# Patient Record
Sex: Female | Born: 1950 | Race: White | Hispanic: No | Marital: Single | Smoking: Former smoker
Health system: Southern US, Community
[De-identification: ages and names within clinical notes are randomized; demographics above are authoritative.]

## PROBLEM LIST (undated history)

## (undated) DIAGNOSIS — R002 Palpitations: Secondary | ICD-10-CM

## (undated) DIAGNOSIS — F32A Depression, unspecified: Secondary | ICD-10-CM

## (undated) DIAGNOSIS — I4891 Unspecified atrial fibrillation: Secondary | ICD-10-CM

## (undated) DIAGNOSIS — T8859XA Other complications of anesthesia, initial encounter: Secondary | ICD-10-CM

## (undated) DIAGNOSIS — T4145XA Adverse effect of unspecified anesthetic, initial encounter: Secondary | ICD-10-CM

## (undated) DIAGNOSIS — F419 Anxiety disorder, unspecified: Secondary | ICD-10-CM

## (undated) DIAGNOSIS — K219 Gastro-esophageal reflux disease without esophagitis: Secondary | ICD-10-CM

## (undated) DIAGNOSIS — Z8679 Personal history of other diseases of the circulatory system: Secondary | ICD-10-CM

## (undated) DIAGNOSIS — M1990 Unspecified osteoarthritis, unspecified site: Secondary | ICD-10-CM

## (undated) DIAGNOSIS — I10 Essential (primary) hypertension: Secondary | ICD-10-CM

## (undated) DIAGNOSIS — I499 Cardiac arrhythmia, unspecified: Secondary | ICD-10-CM

## (undated) DIAGNOSIS — F329 Major depressive disorder, single episode, unspecified: Secondary | ICD-10-CM

## (undated) DIAGNOSIS — I509 Heart failure, unspecified: Secondary | ICD-10-CM

## (undated) DIAGNOSIS — F410 Panic disorder [episodic paroxysmal anxiety] without agoraphobia: Secondary | ICD-10-CM

## (undated) HISTORY — PX: COLONOSCOPY: SHX174

## (undated) HISTORY — PX: BILATERAL SALPINGOOPHORECTOMY: SHX1223

## (undated) HISTORY — DX: Essential (primary) hypertension: I10

## (undated) HISTORY — PX: OTHER SURGICAL HISTORY: SHX169

## (undated) HISTORY — PX: FOOT SURGERY: SHX648

## (undated) HISTORY — PX: ABDOMINAL HYSTERECTOMY: SHX81

## (undated) HISTORY — PX: FOOT FRACTURE SURGERY: SHX645

---

## 2004-09-10 ENCOUNTER — Ambulatory Visit: Payer: Self-pay | Admitting: Obstetrics and Gynecology

## 2008-02-04 ENCOUNTER — Ambulatory Visit: Payer: Self-pay | Admitting: Pain Medicine

## 2008-02-08 ENCOUNTER — Ambulatory Visit: Payer: Self-pay | Admitting: Pain Medicine

## 2008-02-13 ENCOUNTER — Ambulatory Visit: Payer: Self-pay | Admitting: Pain Medicine

## 2008-03-04 ENCOUNTER — Ambulatory Visit: Payer: Self-pay | Admitting: Pain Medicine

## 2008-03-18 ENCOUNTER — Ambulatory Visit: Payer: Self-pay | Admitting: Physician Assistant

## 2008-04-01 ENCOUNTER — Ambulatory Visit: Payer: Self-pay | Admitting: Pain Medicine

## 2008-04-24 ENCOUNTER — Ambulatory Visit: Payer: Self-pay | Admitting: Physician Assistant

## 2008-09-03 ENCOUNTER — Ambulatory Visit: Payer: Self-pay | Admitting: Pain Medicine

## 2008-09-04 ENCOUNTER — Ambulatory Visit: Payer: Self-pay | Admitting: Pain Medicine

## 2008-11-12 ENCOUNTER — Ambulatory Visit: Payer: Self-pay | Admitting: Pain Medicine

## 2008-11-27 ENCOUNTER — Ambulatory Visit: Payer: Self-pay | Admitting: Pain Medicine

## 2008-12-10 ENCOUNTER — Ambulatory Visit: Payer: Self-pay | Admitting: Pain Medicine

## 2009-01-06 ENCOUNTER — Ambulatory Visit: Payer: Self-pay | Admitting: Pain Medicine

## 2009-11-20 ENCOUNTER — Ambulatory Visit: Payer: Self-pay | Admitting: Pain Medicine

## 2010-04-08 ENCOUNTER — Ambulatory Visit: Payer: Self-pay | Admitting: Pain Medicine

## 2010-07-13 ENCOUNTER — Ambulatory Visit: Payer: Self-pay | Admitting: Family Medicine

## 2010-12-25 ENCOUNTER — Emergency Department: Payer: Self-pay | Admitting: Internal Medicine

## 2011-11-11 ENCOUNTER — Ambulatory Visit: Payer: Self-pay | Admitting: Family Medicine

## 2011-11-23 ENCOUNTER — Ambulatory Visit: Payer: Self-pay | Admitting: Pain Medicine

## 2011-12-01 ENCOUNTER — Ambulatory Visit: Payer: Self-pay | Admitting: Pain Medicine

## 2011-12-15 ENCOUNTER — Ambulatory Visit: Payer: Self-pay | Admitting: Pain Medicine

## 2012-01-10 ENCOUNTER — Ambulatory Visit: Payer: Self-pay | Admitting: Pain Medicine

## 2012-10-31 ENCOUNTER — Ambulatory Visit: Payer: Self-pay | Admitting: Pain Medicine

## 2012-11-01 ENCOUNTER — Ambulatory Visit: Payer: Self-pay | Admitting: Pain Medicine

## 2012-12-12 ENCOUNTER — Ambulatory Visit: Payer: Self-pay | Admitting: Pain Medicine

## 2012-12-12 ENCOUNTER — Other Ambulatory Visit: Payer: Self-pay | Admitting: Pain Medicine

## 2013-11-18 ENCOUNTER — Ambulatory Visit: Payer: Self-pay | Admitting: Pain Medicine

## 2013-11-26 ENCOUNTER — Ambulatory Visit: Payer: Self-pay | Admitting: Pain Medicine

## 2014-05-22 ENCOUNTER — Emergency Department: Payer: Self-pay | Admitting: Student

## 2014-05-28 ENCOUNTER — Ambulatory Visit: Payer: Self-pay | Admitting: Podiatry

## 2014-05-28 LAB — POTASSIUM: Potassium: 4.3 mmol/L (ref 3.5–5.1)

## 2014-05-29 ENCOUNTER — Ambulatory Visit: Payer: Medicaid Other

## 2014-05-29 ENCOUNTER — Ambulatory Visit (INDEPENDENT_AMBULATORY_CARE_PROVIDER_SITE_OTHER): Payer: Medicare Other | Admitting: Podiatry

## 2014-05-29 ENCOUNTER — Encounter: Payer: Self-pay | Admitting: Podiatry

## 2014-05-29 VITALS — BP 143/85 | HR 75 | Resp 13 | Ht 62.0 in | Wt 210.0 lb

## 2014-05-29 DIAGNOSIS — M79605 Pain in left leg: Secondary | ICD-10-CM

## 2014-05-29 DIAGNOSIS — S92002A Unspecified fracture of left calcaneus, initial encounter for closed fracture: Secondary | ICD-10-CM | POA: Diagnosis not present

## 2014-05-29 NOTE — Progress Notes (Signed)
Subjective:     Patient ID: Danielle SleeperXheva Robinson, female   DOB: 09/26/1950, 63 y.o.   MRN: [id number]  HPI patient presents with son who states that she fell a week ago and fractured her heel bone left and they want to do surgery we just wanted to see if you have look at her x-rays and CT scans   Review of Systems  All other systems reviewed and are negative.      Objective:   Physical Exam Unable to evaluate her foot as far as dressing goes that she does has have a softer type cast on which is holding her foot in neutral position teary at currently in a wheelchair    Assessment:     Fracture of calcaneus being treated by the physicians at [facility]    Plan:       reviewed condition and at this time I have recommended that she follow the advice of the physicians and [facility] who did evaluate her and are able to perform her surgery at that hospital. I do think long-term she can develop arthritis I explained that to her and I wished her good lock for what the physicians at the hospital decide is most appropriate for her. She has an appointment tomorrow

## 2014-05-29 NOTE — Progress Notes (Signed)
   Subjective:    Patient ID: Danielle SleeperXheva Robinson, female    DOB: 07/16/1950, 63 y.o.   MRN: [id number]  HPI Comments: Pt presents with her son, Glorianne Manchesterim Tirado.  Mr. Gala LewandowskyBerisha states pt fell 1 week ago down 4 to 5 steps breaking her left heel and ankle.  Pt was seen at [facility], she had x-rays, and CT scan, and was later placed in air fracture walker with a medicated type cast.  Pt's son states it was recommended pt have surgery, and he has the disc for the x-rays and CT scan.  Foot Injury       Review of Systems  All other systems reviewed and are negative.      Objective:   Physical Exam        Assessment & Plan:

## 2014-05-30 ENCOUNTER — Ambulatory Visit: Payer: Self-pay | Admitting: Podiatry

## 2014-11-01 DIAGNOSIS — I10 Essential (primary) hypertension: Secondary | ICD-10-CM | POA: Insufficient documentation

## 2014-11-01 NOTE — Op Note (Signed)
PATIENT NAME:  Danielle Robinson, Danielle Robinson MR#:  [id number] DATE OF BIRTH:  [birth year]  DATE OF PROCEDURE:  05/30/2014  PREOPERATIVE DIAGNOSIS:  Left comminuted calcaneal fracture.   POSTOPERATIVE DIAGNOSIS:  Left comminuted calcaneal fracture.   PROCEDURE:  Open reduction with internal fixation of left calcaneal fracture.   SURGEON:  Chieko Neises A. Ether GriffinsFowler, DPM   ANESTHESIA:  General.   HEMOSTASIS:  Mid-calf tourniquet inflated to 250 mmHg for approximately 90 minutes.   COMPLICATIONS:  None.   SPECIMEN:  None.  OPERATIVE INDICATIONS: This is a 64 year old female seen in the outpatient clinic seen with a joint depression calcaneal fracture of the left heel. We discussed conservative and surgical intervention. She has elected to undergo surgical intervention. The risks, benefits, alternatives, and complications associated with surgery were discussed with the patient and informed consent has been given.   OPERATIVE PROCEDURE:  The patient was brought into the OR, placed on the operating table in supine position. General intubation was administered by the anesthesia team. The patient was then placed in left lateral decubitus position, left side up. The left lower extremity was prepped and draped in the usual sterile fashion.  After inflation of the tourniquet, attention was directed to the lateral aspect of the calcaneus where a sinus tarsi incision was made to the level of the fibula. Approximately a 3 cm incision was made. Sharp and blunt dissection was carried down to the deeper layers. The peroneal tendons were noted and retracted throughout the procedure. The subtalar joint was then entered at this time. The lateral wall of the calcaneus was dissected away bluntly and sharply. There was noted to be a small lateral wall blowout. There was a loose comminuted, lateral, posterior facet fracture fragment that was noted that was impacted within the body of the calcaneus. A small Schanz pin was placed in the  calcaneus through the lateral wall and disimpaction was then performed. I was able to freely move to the lateral portion of the posterior facet fracture at this time. Further evaluation of the subtalar joint revealed a central fracture line. Manipulation was then performed to get the cartilage in line throughout the posterior facet. The lateral portion of the posterior facet was then stabilized with a K wire.  At this time, a small minimal incision, lateral calcaneal wall locking plate from [employer] was then placed, subcutaneous deep to the tendon and soft tissue on the lateral aspect of the calcaneus. The initial sustentacular fragment was stabilized with a compression screw. The posterior tuber   was then stabilized with a compression screw and the subtalar joint, posterior facet was stabilized with a compression screw. The remainder of the screws was filled with locking screws. A total of 6 screws were used at this time. Good stability was noted to the subtalar and posterior facet region. Multiple views showed good alignment of the subtalar joint. Illa LevelBroden view showed good alignment of the posterior facet.  At this time, layered closure was performed after copious amounts of irrigation. The patient was placed in a well compressive sterile dressing after infiltration with 0.25% Marcaine with epinephrine. A total of 18 mL was used. She was placed in an equalizer walker boot for nonweightbearing. She will be discharged home. I will see her in the outpatient clinic in 5 to 7 days. A prescription for Percocet for pain was written today.    ____________________________ Argentina DonovanJustin A. Ether GriffinsFowler, DPM jaf:sw D: 05/30/2014 15:36:00 ET T: 05/30/2014 16:13:25 ET JOB#: [id number]  cc: Jill AlexandersJustin A. Ether GriffinsFowler, DPM, <Dictator> Denasia Venn  Jahnaya Branscome DPM ELECTRONICALLY SIGNED 06/15/2014 18:55

## 2014-11-01 NOTE — Op Note (Signed)
PATIENT NAME:  Danielle SleeperBERISHA, Hadasah MR#:  [id number] DATE OF BIRTH:  09/28/1950  DATE OF PROCEDURE:  05/30/2014  PREOPERATIVE DIAGNOSIS: Left calcaneal fracture.  POSTOPERATIVE DIAGNOSIS: Left calcaneal fracture.    PROCEDURE: Open reduction with internal fixation of left calcaneal fracture.   SURGEON: Karinne Schmader A. Ether GriffinsFowler, DPM   ANESTHESIA: General.   ESTIMATED BLOOD LOSS: Minimal.   COMPLICATIONS: None.   SPECIMEN: None.   OPERATIVE INDICATIONS: This is a 64 year old female who sustained a joint depression calcaneal fracture. We discussed conservative and surgical intervention. She has elected to undergo surgery. All risks, benefits, alternatives, and complications associated with surgery were discussed with the patient and full informed consent has been given.   OPERATIVE PROCEDURE: The patient was brought into the OR and placed on the operating table in the supine position. General intubation was administered by the anesthesia team. The patient was then placed in the lateral decubitus position with the left side up. Attention was directed to the lateral aspect of the calcaneus, where after sterile prep and drape and inflation of the thigh tourniquet, a sinus tarsi incision was made. This began just anterior to the ankle joint, overlying the sinus tarsi to the level of the fibula. Sharp and blunt dissection was carried down to the calcaneus. The peroneal tendons were noted throughout the procedure and retracted. At this time exposure of the subtalar joint was performed. There was noted to be a joint depression calcaneal fracture. The lateral portion of the posterior facet was impacted into the body of the calcaneus and markedly displaced. There was noted a central fracture line within the subtalar joint as well. There was a small lateral wall blowout that was dissected off of the lateral aspect of the heel. At this time initially the displaced lateral posterior facet was stabilized with a K-wire. The  lateral calcaneal wall was placed in a good position. After placement, all areas were then reduced. The minimal incision calcaneal plate was then introduced into the lateral aspect of the incision site into a good position. The initial sustentacular screw was placed to stabilize the plate to be lateral wall of the calcaneus, and this did grab the sustentacular fragment quite well. Next, the posterior facet was then stabilized with a 3.5 screw. A third screw was placed in the posterior arm of the plate. Good stability and alignment was noted. There was noted to be good compression of the fracture site with much better anatomically aligned position. Further screw holes were then filled with a combination of locking and nonlocking screws. The lateral axial and Broden views were taken, that showed good alignment of the calcaneal fracture site. The subtalar joint and posterior facet was in a good anatomically aligned position.   The wound was flushed with copious amounts of irrigation and layered closure was performed with Vicryl and nylon. A block was placed around the ankle with long acting Marcaine injection. She was then placed in a well compressive sterile dressing and back into her boot. She will be nonweightbearing. I will see her in the outpatient clinic in 5-7 days.    ____________________________ Argentina DonovanJustin A. Ether GriffinsFowler, DPM jaf:MT D: 06/06/2014 08:14:01 ET T: 06/06/2014 08:35:59 ET JOB#: [id number]  cc: Jill AlexandersJustin A. Ether GriffinsFowler, DPM, <Dictator> Clary Boulais DPM ELECTRONICALLY SIGNED 06/15/2014 18:55

## 2015-08-13 ENCOUNTER — Other Ambulatory Visit: Payer: Self-pay | Admitting: Orthopedic Surgery

## 2015-08-13 DIAGNOSIS — G8929 Other chronic pain: Secondary | ICD-10-CM

## 2015-08-13 DIAGNOSIS — M25561 Pain in right knee: Principal | ICD-10-CM

## 2015-09-03 ENCOUNTER — Ambulatory Visit
Admission: RE | Admit: 2015-09-03 | Discharge: 2015-09-03 | Disposition: A | Discharge status: Home / Self Care | Payer: Medicare Other | Source: Ambulatory Visit | Attending: Orthopedic Surgery | Admitting: Orthopedic Surgery

## 2015-09-03 DIAGNOSIS — M659 Synovitis and tenosynovitis, unspecified: Secondary | ICD-10-CM | POA: Insufficient documentation

## 2015-09-03 DIAGNOSIS — M25861 Other specified joint disorders, right knee: Secondary | ICD-10-CM | POA: Insufficient documentation

## 2015-09-03 DIAGNOSIS — M1711 Unilateral primary osteoarthritis, right knee: Secondary | ICD-10-CM | POA: Insufficient documentation

## 2015-09-03 DIAGNOSIS — M25461 Effusion, right knee: Secondary | ICD-10-CM | POA: Diagnosis not present

## 2015-09-03 DIAGNOSIS — S83221A Peripheral tear of medial meniscus, current injury, right knee, initial encounter: Secondary | ICD-10-CM | POA: Insufficient documentation

## 2015-09-03 DIAGNOSIS — S83271A Complex tear of lateral meniscus, current injury, right knee, initial encounter: Secondary | ICD-10-CM | POA: Insufficient documentation

## 2015-09-03 DIAGNOSIS — M25561 Pain in right knee: Secondary | ICD-10-CM | POA: Diagnosis present

## 2015-09-03 DIAGNOSIS — G8929 Other chronic pain: Secondary | ICD-10-CM | POA: Insufficient documentation

## 2015-09-18 ENCOUNTER — Encounter
Admission: RE | Admit: 2015-09-18 | Discharge: 2015-09-18 | Disposition: A | Discharge status: Home / Self Care | Payer: Medicare Other | Source: Ambulatory Visit | Attending: Orthopedic Surgery | Admitting: Orthopedic Surgery

## 2015-09-18 DIAGNOSIS — Z01810 Encounter for preprocedural cardiovascular examination: Secondary | ICD-10-CM | POA: Insufficient documentation

## 2015-09-18 DIAGNOSIS — Z01812 Encounter for preprocedural laboratory examination: Secondary | ICD-10-CM | POA: Insufficient documentation

## 2015-09-18 HISTORY — DX: Major depressive disorder, single episode, unspecified: F32.9

## 2015-09-18 HISTORY — DX: Other complications of anesthesia, initial encounter: T88.59XA

## 2015-09-18 HISTORY — DX: Depression, unspecified: F32.A

## 2015-09-18 HISTORY — DX: Palpitations: R00.2

## 2015-09-18 HISTORY — DX: Anxiety disorder, unspecified: F41.9

## 2015-09-18 HISTORY — DX: Unspecified osteoarthritis, unspecified site: M19.90

## 2015-09-18 HISTORY — DX: Personal history of other diseases of the circulatory system: Z86.79

## 2015-09-18 HISTORY — DX: Unspecified atrial fibrillation: I48.91

## 2015-09-18 HISTORY — DX: Adverse effect of unspecified anesthetic, initial encounter: T41.45XA

## 2015-09-18 LAB — CBC
HCT: 37.4 % (ref 35.0–47.0)
HEMOGLOBIN: 12.6 g/dL (ref 12.0–16.0)
MCH: 29.7 pg (ref 26.0–34.0)
MCHC: 33.6 g/dL (ref 32.0–36.0)
MCV: 88.6 fL (ref 80.0–100.0)
Platelets: 205 10*3/uL (ref 150–440)
RBC: 4.23 MIL/uL (ref 3.80–5.20)
RDW: 13.8 % (ref 11.5–14.5)
WBC: 4.1 10*3/uL (ref 3.6–11.0)

## 2015-09-18 NOTE — Patient Instructions (Signed)
  Your procedure is scheduled on: Monday 09/28/15 Report to Day Surgery. 2ND FLOOR MEDICAL MALL ENTRANCE To find out your arrival time please call [phone] between 1PM - 3PM on Friday 09/25/15.  Remember: Instructions that are not followed completely may result in serious medical risk, up to and including death, or upon the discretion of your surgeon and anesthesiologist your surgery may need to be rescheduled.    __X__ 1. Do not eat food or drink liquids after midnight. No gum chewing or hard candies.     __X__ 2. No Alcohol for 24 hours before or after surgery.   ____ 3. Bring all medications with you on the day of surgery if instructed.    __X__ 4. Notify your doctor if there is any change in your medical condition     (cold, fever, infections).     Do not wear jewelry, make-up, hairpins, clips or nail polish.  Do not wear lotions, powders, or perfumes.   Do not shave 48 hours prior to surgery. Men may shave face and neck.  Do not bring valuables to the hospital.    [facility] any belongings or valuables.               Contacts, dentures or bridgework may not be worn into surgery.  Leave your suitcase in the car. After surgery it may be brought to your room.  For patients admitted to the hospital, discharge time is determined by your                treatment team.   Patients discharged the day of surgery will not be allowed to drive home.   Please read over the following fact sheets that you were given:   Surgical Site Infection Prevention   __X__ Take these medicines the morning of surgery with A SIP OF WATER:    1. VERAPAMIL  2.   3.   4.  5.  6.  ____ Fleet Enema (as directed)   __X__ Use CHG Soap as directed  ____ Use inhalers on the day of surgery  ____ Stop metformin 2 days prior to surgery    ____ Take 1/2 of usual insulin dose the night before surgery and none on the morning of surgery.   ____ Stop Coumadin/Plavix/aspirin on    __X__ Stop Anti-inflammatories on STOP IBUPROFEN 7 DAYS PRIOR TO SURGERY YOU MAY TAKE TYLENOL ONLY FOR PAIN IF NEEDED   __X__ Stop supplements until after surgery.  STOP FISH OIL AND VITAMIN B12 AS ABOVE  ____ Bring C-Pap to the hospital.

## 2015-09-28 ENCOUNTER — Encounter: Payer: Self-pay | Admitting: Orthopedic Surgery

## 2015-09-28 ENCOUNTER — Ambulatory Visit: Payer: Medicare Other | Admitting: Anesthesiology

## 2015-09-28 ENCOUNTER — Ambulatory Visit
Admission: RE | Admit: 2015-09-28 | Discharge: 2015-09-28 | Disposition: A | Discharge status: Home / Self Care | Payer: Medicare Other | Source: Ambulatory Visit | Attending: Orthopedic Surgery | Admitting: Orthopedic Surgery

## 2015-09-28 ENCOUNTER — Encounter
Admission: RE | Disposition: A | Discharge status: Home / Self Care | Payer: Self-pay | Source: Ambulatory Visit | Attending: Orthopedic Surgery

## 2015-09-28 DIAGNOSIS — M23252 Derangement of posterior horn of lateral meniscus due to old tear or injury, left knee: Secondary | ICD-10-CM | POA: Insufficient documentation

## 2015-09-28 DIAGNOSIS — M23242 Derangement of anterior horn of lateral meniscus due to old tear or injury, left knee: Secondary | ICD-10-CM | POA: Diagnosis not present

## 2015-09-28 DIAGNOSIS — F329 Major depressive disorder, single episode, unspecified: Secondary | ICD-10-CM | POA: Diagnosis not present

## 2015-09-28 DIAGNOSIS — I10 Essential (primary) hypertension: Secondary | ICD-10-CM | POA: Diagnosis not present

## 2015-09-28 DIAGNOSIS — I4891 Unspecified atrial fibrillation: Secondary | ICD-10-CM | POA: Insufficient documentation

## 2015-09-28 DIAGNOSIS — Z79899 Other long term (current) drug therapy: Secondary | ICD-10-CM | POA: Diagnosis not present

## 2015-09-28 DIAGNOSIS — Z87891 Personal history of nicotine dependence: Secondary | ICD-10-CM | POA: Diagnosis not present

## 2015-09-28 DIAGNOSIS — M23221 Derangement of posterior horn of medial meniscus due to old tear or injury, right knee: Secondary | ICD-10-CM | POA: Diagnosis not present

## 2015-09-28 DIAGNOSIS — M2391 Unspecified internal derangement of right knee: Secondary | ICD-10-CM | POA: Insufficient documentation

## 2015-09-28 DIAGNOSIS — M94261 Chondromalacia, right knee: Secondary | ICD-10-CM | POA: Diagnosis not present

## 2015-09-28 HISTORY — PX: KNEE ARTHROSCOPY: SHX127

## 2015-09-28 LAB — POCT I-STAT 4, (NA,K, GLUC, HGB,HCT)
GLUCOSE: 90 mg/dL (ref 65–99)
HCT: 44 % (ref 36.0–46.0)
Hemoglobin: 15 g/dL (ref 12.0–15.0)
POTASSIUM: 4 mmol/L (ref 3.5–5.1)
Sodium: 141 mmol/L (ref 135–145)

## 2015-09-28 SURGERY — ARTHROSCOPY, KNEE
Anesthesia: General | Site: Knee | Laterality: Right | Wound class: Clean

## 2015-09-28 MED ORDER — FENTANYL CITRATE (PF) 100 MCG/2ML IJ SOLN
25.0000 ug | INTRAMUSCULAR | Status: DC | PRN
  Administered 2015-09-28 (×4): 25 ug via INTRAVENOUS

## 2015-09-28 MED ORDER — HYDROCODONE-ACETAMINOPHEN 5-325 MG PO TABS
1.0000 | ORAL_TABLET | ORAL | Status: DC | PRN
  Administered 2015-09-28: 1 via ORAL

## 2015-09-28 MED ORDER — HYDROCODONE-ACETAMINOPHEN 5-325 MG PO TABS
ORAL_TABLET | ORAL | Status: AC
  Filled 2015-09-28: qty 1

## 2015-09-28 MED ORDER — ONDANSETRON HCL 4 MG PO TABS
4.0000 mg | ORAL_TABLET | Freq: Four times a day (QID) | ORAL | Status: DC | PRN
Start: 2015-09-28 — End: 2015-09-28

## 2015-09-28 MED ORDER — MORPHINE SULFATE (PF) 4 MG/ML IV SOLN
INTRAVENOUS | Status: AC
  Filled 2015-09-28: qty 1

## 2015-09-28 MED ORDER — FENTANYL CITRATE (PF) 100 MCG/2ML IJ SOLN
INTRAMUSCULAR | Status: AC
  Administered 2015-09-28: 25 ug via INTRAVENOUS
  Filled 2015-09-28: qty 2

## 2015-09-28 MED ORDER — FAMOTIDINE 20 MG PO TABS
ORAL_TABLET | ORAL | Status: AC
  Filled 2015-09-28: qty 1

## 2015-09-28 MED ORDER — ONDANSETRON HCL 4 MG/2ML IJ SOLN
INTRAMUSCULAR | Status: AC
  Filled 2015-09-28: qty 2

## 2015-09-28 MED ORDER — HYDROCODONE-ACETAMINOPHEN 5-325 MG PO TABS: 1.0000 | ORAL_TABLET | ORAL | Status: DC | PRN

## 2015-09-28 MED ORDER — ONDANSETRON HCL 4 MG/2ML IJ SOLN
INTRAMUSCULAR | Status: DC | PRN
  Administered 2015-09-28: 4 mg via INTRAVENOUS

## 2015-09-28 MED ORDER — ACETAMINOPHEN 10 MG/ML IV SOLN
INTRAVENOUS | Status: DC | PRN
  Administered 2015-09-28: 1000 mg via INTRAVENOUS

## 2015-09-28 MED ORDER — ONDANSETRON HCL 4 MG/2ML IJ SOLN
4.0000 mg | Freq: Once | INTRAMUSCULAR | Status: AC | PRN
  Administered 2015-09-28: 4 mg via INTRAVENOUS

## 2015-09-28 MED ORDER — MORPHINE SULFATE (PF) 4 MG/ML IV SOLN
INTRAVENOUS | Status: DC | PRN
  Administered 2015-09-28: 4 mg

## 2015-09-28 MED ORDER — MIDAZOLAM HCL 2 MG/2ML IJ SOLN
INTRAMUSCULAR | Status: DC | PRN
  Administered 2015-09-28: 2 mg via INTRAVENOUS

## 2015-09-28 MED ORDER — DEXAMETHASONE SODIUM PHOSPHATE 10 MG/ML IJ SOLN
INTRAMUSCULAR | Status: DC | PRN
  Administered 2015-09-28: 5 mg via INTRAVENOUS

## 2015-09-28 MED ORDER — GLYCOPYRROLATE 0.2 MG/ML IJ SOLN
INTRAMUSCULAR | Status: DC | PRN
  Administered 2015-09-28: 0.2 mg via INTRAVENOUS

## 2015-09-28 MED ORDER — LIDOCAINE HCL (CARDIAC) 20 MG/ML IV SOLN
INTRAVENOUS | Status: DC | PRN
  Administered 2015-09-28: 80 mg via INTRAVENOUS

## 2015-09-28 MED ORDER — SODIUM CHLORIDE 0.9 % IV SOLN: INTRAVENOUS | Status: DC

## 2015-09-28 MED ORDER — ACETAMINOPHEN 10 MG/ML IV SOLN
INTRAVENOUS | Status: AC
  Filled 2015-09-28: qty 100

## 2015-09-28 MED ORDER — BUPIVACAINE-EPINEPHRINE (PF) 0.25% -1:200000 IJ SOLN
INTRAMUSCULAR | Status: AC
  Filled 2015-09-28: qty 30

## 2015-09-28 MED ORDER — FAMOTIDINE 20 MG PO TABS
20.0000 mg | ORAL_TABLET | Freq: Once | ORAL | Status: AC
  Administered 2015-09-28: 20 mg via ORAL

## 2015-09-28 MED ORDER — LACTATED RINGERS IV SOLN
INTRAVENOUS | Status: DC
  Administered 2015-09-28 (×2): via INTRAVENOUS

## 2015-09-28 MED ORDER — METOCLOPRAMIDE HCL 10 MG PO TABS: 5.0000 mg | ORAL_TABLET | Freq: Three times a day (TID) | ORAL | Status: DC | PRN

## 2015-09-28 MED ORDER — ONDANSETRON HCL 4 MG/2ML IJ SOLN: 4.0000 mg | Freq: Four times a day (QID) | INTRAMUSCULAR | Status: DC | PRN

## 2015-09-28 MED ORDER — FENTANYL CITRATE (PF) 100 MCG/2ML IJ SOLN
INTRAMUSCULAR | Status: DC | PRN
  Administered 2015-09-28: 25 ug via INTRAVENOUS
  Administered 2015-09-28: 50 ug via INTRAVENOUS
  Administered 2015-09-28: 25 ug via INTRAVENOUS

## 2015-09-28 MED ORDER — METOCLOPRAMIDE HCL 5 MG/ML IJ SOLN: 5.0000 mg | Freq: Three times a day (TID) | INTRAMUSCULAR | Status: DC | PRN

## 2015-09-28 MED ORDER — BUPIVACAINE-EPINEPHRINE 0.25% -1:200000 IJ SOLN
INTRAMUSCULAR | Status: DC | PRN
  Administered 2015-09-28: 25 mL
  Administered 2015-09-28: 5 mL

## 2015-09-28 MED ORDER — PROPOFOL 10 MG/ML IV BOLUS
INTRAVENOUS | Status: DC | PRN
  Administered 2015-09-28: 170 mg via INTRAVENOUS

## 2015-09-28 SURGICAL SUPPLY — 21 items
BLADE SHAVER 4.5 DBL SERAT CV (CUTTER) ×3 IMPLANT
BNDG ESMARK 6X12 TAN STRL LF (GAUZE/BANDAGES/DRESSINGS) ×3 IMPLANT
DRSG DERMACEA 8X12 NADH (GAUZE/BANDAGES/DRESSINGS) ×3 IMPLANT
DURAPREP 26ML APPLICATOR (WOUND CARE) ×6 IMPLANT
GAUZE SPONGE 4X4 12PLY STRL (GAUZE/BANDAGES/DRESSINGS) ×3 IMPLANT
GLOVE BIOGEL M STRL SZ7.5 (GLOVE) ×3 IMPLANT
GLOVE INDICATOR 8.0 STRL GRN (GLOVE) ×3 IMPLANT
GOWN STRL REUS W/ TWL LRG LVL3 (GOWN DISPOSABLE) ×2 IMPLANT
GOWN STRL REUS W/TWL LRG LVL3 (GOWN DISPOSABLE) ×4
IV LACTATED RINGER IRRG 3000ML (IV SOLUTION) ×12
IV LR IRRIG 3000ML ARTHROMATIC (IV SOLUTION) ×6 IMPLANT
MANIFOLD NEPTUNE II (INSTRUMENTS) ×3 IMPLANT
PACK ARTHROSCOPY KNEE (MISCELLANEOUS) ×3 IMPLANT
SET TUBE SUCT SHAVER OUTFL 24K (TUBING) ×3 IMPLANT
SET TUBE TIP INTRA-ARTICULAR (MISCELLANEOUS) ×3 IMPLANT
STRAP SAFETY BODY (MISCELLANEOUS) ×3 IMPLANT
SUT ETHILON 3-0 FS-10 30 BLK (SUTURE) ×3
SUTURE EHLN 3-0 FS-10 30 BLK (SUTURE) ×1 IMPLANT
TUBING ARTHRO INFLOW-ONLY STRL (TUBING) ×3 IMPLANT
WAND HAND CNTRL MULTIVAC 50 (MISCELLANEOUS) ×3 IMPLANT
WRAP KNEE W/COLD PACKS 25.5X14 (SOFTGOODS) ×3 IMPLANT

## 2015-09-28 NOTE — Anesthesia Preprocedure Evaluation (Addendum)
Anesthesia Evaluation  Patient identified by MRN, date of birth, ID band Patient awake    Reviewed: Allergy & Precautions, H&P , NPO status , Patient's Chart, lab work & pertinent test results, reviewed documented beta blocker date and time   History of Anesthesia Complications (+) PROLONGED EMERGENCE and history of anesthetic complications  Airway Mallampati: III  TM Distance: >3 FB Neck ROM: full    Dental  (+) Teeth Intact, Partial Upper   Pulmonary neg pulmonary ROS, former smoker,    Pulmonary exam normal        Cardiovascular Exercise Tolerance: Good hypertension, negative cardio ROS Normal cardiovascular examAtrial Fibrillation  Rate:Normal     Neuro/Psych PSYCHIATRIC DISORDERS negative neurological ROS  negative psych ROS   GI/Hepatic negative GI ROS, Neg liver ROS,   Endo/Other  negative endocrine ROS  Renal/GU negative Renal ROS  negative genitourinary   Musculoskeletal   Abdominal   Peds  Hematology negative hematology ROS (+)   Anesthesia Other Findings   Reproductive/Obstetrics negative OB ROS                            Anesthesia Physical Anesthesia Plan  ASA: III  Anesthesia Plan: General LMA   Post-op Pain Management:    Induction:   Airway Management Planned:   Additional Equipment:   Intra-op Plan:   Post-operative Plan:   Informed Consent: I have reviewed the patients History and Physical, chart, labs and discussed the procedure including the risks, benefits and alternatives for the proposed anesthesia with the patient or authorized representative who has indicated his/her understanding and acceptance.     Plan Discussed with: CRNA  Anesthesia Plan Comments:        Anesthesia Quick Evaluation

## 2015-09-28 NOTE — Anesthesia Postprocedure Evaluation (Signed)
Anesthesia Post Note  Patient: Danielle Robinson  Procedure(s) Performed: Procedure(s) (LRB): ARTHROSCOPY KNEE, MEDIAL AND LATERAL MENISECTOMY, CHONDROPLASTY (Right)  Patient location during evaluation: PACU Anesthesia Type: General Level of consciousness: awake and alert Pain management: pain level controlled Vital Signs Assessment: post-procedure vital signs reviewed and stable Respiratory status: spontaneous breathing and respiratory function stable Cardiovascular status: stable Anesthetic complications: no    Last Vitals:  Filed Vitals:   09/28/15 1736 09/28/15 1743  BP: 113/69   Pulse: 58 58  Temp: 36.3 C   Resp: 17 12    Last Pain:  Filed Vitals:   09/28/15 1744  PainSc: Asleep                 KEPHART,WILLIAM K

## 2015-09-28 NOTE — Anesthesia Procedure Notes (Signed)
Procedure Name: LMA Insertion Date/Time: 09/28/2015 3:53 PM Performed by: Stormy FabianURTIS, Genny Caulder Pre-anesthesia Checklist: Patient identified, Patient being monitored, Timeout performed, Emergency Drugs available and Suction available Patient Re-evaluated:Patient Re-evaluated prior to inductionOxygen Delivery Method: Circle system utilized Preoxygenation: Pre-oxygenation with 100% oxygen Intubation Type: IV induction Ventilation: Mask ventilation without difficulty LMA: LMA inserted LMA Size: 4.5 Tube type: Oral Number of attempts: 1 Placement Confirmation: positive ETCO2 and breath sounds checked- equal and bilateral Tube secured with: Tape Dental Injury: Teeth and Oropharynx as per pre-operative assessment

## 2015-09-28 NOTE — Brief Op Note (Signed)
09/28/2015  5:34 PM  PATIENT:  Danielle SleeperXheva Paige  65 y.o. female  PRE-OPERATIVE DIAGNOSIS:  INTERNAL DERANGEMENT, right knee  POST-OPERATIVE DIAGNOSIS:  tear posterior horn medial meniscus, tear anterior & posterior horn lateral meniscus, grade IV chondromalacia lateral compartment, grade III patellofemoral  PROCEDURE:  Procedure(s): ARTHROSCOPY KNEE, MEDIAL AND LATERAL MENISECTOMY, CHONDROPLASTY (Right)  SURGEON:  Surgeon(s) and Role:    * Donato HeinzJames P Julius Boniface, MD - Primary  ASSISTANTS: none   ANESTHESIA:   general  EBL:  Total I/O In: 1000 [I.V.:1000] Out: -   BLOOD ADMINISTERED:none  DRAINS: none   LOCAL MEDICATIONS USED:  MARCAINE     SPECIMEN:  No Specimen  DISPOSITION OF SPECIMEN:  N/A  COUNTS:  YES  TOURNIQUET:   not used  DICTATION: .Office managerDragon Dictation  PLAN OF CARE: Discharge to home after PACU  PATIENT DISPOSITION:  PACU - hemodynamically stable.   Delay start of Pharmacological VTE agent (>24hrs) due to surgical blood loss or risk of bleeding: not applicable

## 2015-09-28 NOTE — Discharge Instructions (Signed)
°  Instructions after Knee Arthroscopy  ° °- James P. Hooten, Jr., M.D.    ° Dept. of Orthopaedics & Sports Medicine ° [facility] ° [address] ° [place], [place]  [address] ° ° Phone: [phone]   Fax: [phone] ° ° °DIET: °• Drink plenty of non-alcoholic fluids & begin a light diet. °• Resume your normal diet the day after surgery. ° °ACTIVITY:  °• You may use crutches or a walker with weight-bearing as tolerated, unless instructed otherwise. °• You may wean yourself off of the walker or crutches as tolerated.  °• Begin doing gentle exercises. Exercising will reduce the pain and swelling, increase motion, and prevent muscle weakness.   °• Avoid strenuous activities or athletics for a minimum of 4-6 weeks after arthroscopic surgery. °• Do not drive or operate any equipment until instructed. ° °WOUND CARE:  °• Place one to two pillows under the knee the first day or two when sitting or lying.  °• Continue to use the ice packs periodically to reduce pain and swelling. °• The small incisions in your knee are closed with nylon stitches. The stitches will be removed in the office. °• The bulky dressing may be removed on the second day after surgery. DO NOT TOUCH THE STITCHES. Put a Band-Aid over each stitch. Do NOT use any ointments or creams on the incisions.  °• You may bathe or shower after the stitches are removed at the first office visit following surgery. ° °MEDICATIONS: °• You may resume your regular medications. °• Please take the pain medication as prescribed. °• Do not take pain medication on an empty stomach. °• Do not drive or drink alcoholic beverages when taking pain medications. ° °CALL THE OFFICE FOR: °• Temperature above 101 degrees °• Excessive bleeding or drainage on the dressing. °• Excessive swelling, coldness, or paleness of the toes. °• Persistent nausea and vomiting. ° °FOLLOW-UP:  °• You should have an appointment to return to the office in 7-10 days after surgery.   ° ° °AMBULATORY SURGERY  °DISCHARGE INSTRUCTIONS ° ° °1) The drugs that you were given will stay in your system until tomorrow so for the next 24 hours you should not: ° °A) Drive an automobile °B) Make any legal decisions °C) Drink any alcoholic beverage ° ° °2) You may resume regular meals tomorrow.  Today it is better to start with liquids and gradually work up to solid foods. ° °You may eat anything you prefer, but it is better to start with liquids, then soup and crackers, and gradually work up to solid foods. ° ° °3) Please notify your doctor immediately if you have any unusual bleeding, trouble breathing, redness and pain at the surgery site, drainage, fever, or pain not relieved by medication. ° ° ° °4) Additional Instructions: ° ° ° ° ° ° ° °Please contact your physician with any problems or Same Day Surgery at [phone], Monday through Friday 6 am to 4 pm, or [place] [facility] [place] Main number at [phone]. ° °

## 2015-09-28 NOTE — Transfer of Care (Signed)
Immediate Anesthesia Transfer of Care Note  Patient: Danielle Robinson  Procedure(s) Performed: Procedure(s): ARTHROSCOPY KNEE, MEDIAL AND LATERAL MENISECTOMY, CHONDROPLASTY (Right)  Patient Location: PACU  Anesthesia Type:General  Level of Consciousness: sedated  Airway & Oxygen Therapy: Patient Spontanous Breathing and Patient connected to face mask oxygen  Post-op Assessment: Post -op Vital signs reviewed and stable  Post vital signs: stable  Last Vitals:  Filed Vitals:   09/28/15 1430  BP: 159/83  Pulse: 69  Temp: 36.2 C  Resp: 16    Complications: No apparent anesthesia complications

## 2015-09-28 NOTE — Op Note (Signed)
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   ASSISTANT: none  ANESTHESIA: general  ESTIMATED BLOOD LOSS: Minimal  FLUIDS REPLACED: 1000 mL of crystalloid  TOURNIQUET TIME: Not used   DRAINS: none  IMPLANTS UTILIZED: None  INDICATIONS FOR SURGERY: Danielle Robinson is a 65 y.o. year old female who has been seen for complaints of right knee pain. MRI demonstrated findings consistent with meniscal pathology. After discussion of the risks and benefits of surgical intervention, the patient expressed understanding of the risks benefits and agree with plans for right knee arthroscopy.   PROCEDURE IN DETAIL: The patient was brought into the operating room and, after adequate general anesthesia was achieved, a tourniquet was applied to the right thigh and the leg was placed in the leg holder. All bony prominences were well padded. The patient's right knee was cleaned and prepped with alcohol and Duraprep and draped in the usual sterile fashion. A "timeout" was performed as per usual protocol. The anticipated portal sites were injected with 0.25% Marcaine with epinephrine. An anterolateral incision was made and a cannula was inserted. A moderate effusion was evacuated and the knee was distended with fluid using the pump. The scope was advanced down the medial gutter into the medial compartment. Under visualization with the  scope, an anteromedial portal was created and a hooked probe was inserted. The medial meniscus was visualized and probed. There was a horizontal tear involving the posterior horn of the medial meniscus. The tear was debrided using meniscal punches and a 4.5 mm incisor shaver. Final contouring was performed using an ArthroCare wand. The remaining rim of meniscus was probed and felt to be stable. The articular cartilage was visualized. There was an area of grade 3 chondromalacia involving the posterior aspect of the medial femoral condyle. The area was debrided and contoured using the ArthroCare wand.  The scope was then advanced into the intercondylar notch. The anterior cruciate ligament was visualized and probed and felt to be intact. The scope was removed from the lateral portal and reinserted via the anteromedial portal to better visualize the lateral compartment. The lateral meniscus was visualized and probed. A complex degenerative tears were noted involving both the anterior and posterior horns of the lateral meniscus. The tears were debrided using meniscal punches and a 4.5 mm incisor shaver. Almost the entire anterior horn was resected. An ulnar rim of the posterior horn was still present. Final contouring was performed using the 50 ArthroCare wand. The articular cartilage of the lateral compartment was visualized. Full-thickness loss of the articular cartilage was noted to both the lateral tibial plateau and lateral femoral condyle. The margins were contoured using the ArthroCare wand. Finally, the scope was advanced so as to visualize the patellofemoral articulation. Good patellar tracking was appreciated. Grade 3 changes of chondromalacia were noted to the patellar facets as well as to the sulcus. These areas were contoured and debrided using the ArthroCare wand.  The knee was  irrigated with copius amounts of fluid and suctioned dry. The anterolateral portal was re-approximated with #3-0 nylon. A  combination of 0.25% Marcaine with epinephrine and 4 mg of Morphine were injected via the scope. The scope was removed and the anteromedial portal was re-approximated with #3-0 nylon. A sterile dressing was applied followed by application of an ice wrap.  The patient tolerated the procedure well and was transported to the PACU in stable condition.  James P. Angie FavaHooten, Jr., M.D.

## 2015-09-28 NOTE — H&P (Signed)
The patient has been re-examined, and the chart reviewed, and there have been no interval changes to the documented history and physical.    The risks, benefits, and alternatives have been discussed at length. The patient expressed understanding of the risks benefits and agreed with plans for surgical intervention.  Kawon Willcutt P. Pammy Vesey, Jr. M.D.    

## 2015-09-29 ENCOUNTER — Encounter: Payer: Self-pay | Admitting: Orthopedic Surgery

## 2016-07-05 DIAGNOSIS — E669 Obesity, unspecified: Secondary | ICD-10-CM | POA: Insufficient documentation

## 2018-08-10 ENCOUNTER — Encounter: Payer: Self-pay | Admitting: *Deleted

## 2018-08-13 ENCOUNTER — Ambulatory Visit: Payer: Medicare Other | Admitting: Anesthesiology

## 2018-08-13 ENCOUNTER — Encounter
Admission: RE | Disposition: A | Discharge status: Home / Self Care | Payer: Self-pay | Source: Home / Self Care | Attending: Unknown Physician Specialty

## 2018-08-13 ENCOUNTER — Encounter: Payer: Self-pay | Admitting: Anesthesiology

## 2018-08-13 ENCOUNTER — Ambulatory Visit
Admission: RE | Admit: 2018-08-13 | Discharge: 2018-08-13 | Disposition: A | Discharge status: Home / Self Care | Payer: Medicare Other | Attending: Unknown Physician Specialty | Admitting: Unknown Physician Specialty

## 2018-08-13 DIAGNOSIS — Z1211 Encounter for screening for malignant neoplasm of colon: Secondary | ICD-10-CM | POA: Diagnosis present

## 2018-08-13 DIAGNOSIS — Z7982 Long term (current) use of aspirin: Secondary | ICD-10-CM | POA: Insufficient documentation

## 2018-08-13 DIAGNOSIS — K5730 Diverticulosis of large intestine without perforation or abscess without bleeding: Secondary | ICD-10-CM | POA: Diagnosis not present

## 2018-08-13 DIAGNOSIS — I10 Essential (primary) hypertension: Secondary | ICD-10-CM | POA: Diagnosis not present

## 2018-08-13 DIAGNOSIS — M1990 Unspecified osteoarthritis, unspecified site: Secondary | ICD-10-CM | POA: Diagnosis not present

## 2018-08-13 DIAGNOSIS — I4891 Unspecified atrial fibrillation: Secondary | ICD-10-CM | POA: Insufficient documentation

## 2018-08-13 DIAGNOSIS — Z79899 Other long term (current) drug therapy: Secondary | ICD-10-CM | POA: Insufficient documentation

## 2018-08-13 DIAGNOSIS — F419 Anxiety disorder, unspecified: Secondary | ICD-10-CM | POA: Insufficient documentation

## 2018-08-13 DIAGNOSIS — K640 First degree hemorrhoids: Secondary | ICD-10-CM | POA: Insufficient documentation

## 2018-08-13 DIAGNOSIS — F329 Major depressive disorder, single episode, unspecified: Secondary | ICD-10-CM | POA: Diagnosis not present

## 2018-08-13 DIAGNOSIS — Z87891 Personal history of nicotine dependence: Secondary | ICD-10-CM | POA: Insufficient documentation

## 2018-08-13 HISTORY — PX: COLONOSCOPY WITH PROPOFOL: SHX5780

## 2018-08-13 HISTORY — DX: Panic disorder (episodic paroxysmal anxiety): F41.0

## 2018-08-13 SURGERY — COLONOSCOPY WITH PROPOFOL
Anesthesia: General

## 2018-08-13 MED ORDER — EPHEDRINE SULFATE 50 MG/ML IJ SOLN
INTRAMUSCULAR | Status: DC | PRN
  Administered 2018-08-13 (×2): 10 mg via INTRAVENOUS

## 2018-08-13 MED ORDER — MIDAZOLAM HCL 2 MG/2ML IJ SOLN
INTRAMUSCULAR | Status: AC
  Filled 2018-08-13: qty 2

## 2018-08-13 MED ORDER — SODIUM CHLORIDE 0.9 % IV SOLN
INTRAVENOUS | Status: DC
  Administered 2018-08-13: 11:00:00 via INTRAVENOUS

## 2018-08-13 MED ORDER — FENTANYL CITRATE (PF) 100 MCG/2ML IJ SOLN
INTRAMUSCULAR | Status: DC | PRN
  Administered 2018-08-13: 50 ug via INTRAVENOUS

## 2018-08-13 MED ORDER — SODIUM CHLORIDE 0.9 % IV SOLN: INTRAVENOUS | Status: DC

## 2018-08-13 MED ORDER — PROPOFOL 500 MG/50ML IV EMUL
INTRAVENOUS | Status: DC | PRN
  Administered 2018-08-13: 120 ug/kg/min via INTRAVENOUS

## 2018-08-13 MED ORDER — EPHEDRINE SULFATE 50 MG/ML IJ SOLN
INTRAMUSCULAR | Status: AC
  Filled 2018-08-13: qty 1

## 2018-08-13 MED ORDER — FENTANYL CITRATE (PF) 100 MCG/2ML IJ SOLN
INTRAMUSCULAR | Status: AC
  Filled 2018-08-13: qty 4

## 2018-08-13 MED ORDER — MIDAZOLAM HCL 2 MG/2ML IJ SOLN
INTRAMUSCULAR | Status: DC | PRN
  Administered 2018-08-13: 2 mg via INTRAVENOUS

## 2018-08-13 MED ORDER — LIDOCAINE HCL (CARDIAC) PF 100 MG/5ML IV SOSY: PREFILLED_SYRINGE | INTRAVENOUS | Status: DC | PRN

## 2018-08-13 NOTE — Op Note (Signed)
[facility] Gastroenterology Patient Name: Danielle Robinson Procedure Date: 08/13/2018 11:01 AM MRN: [id number] Account #: [id number] Date of Birth: 29-Dec-1950 Admit Type: Outpatient Age: 68 Room: [facility] ENDO ROOM 1 Gender: Female Note Status: Finalized Procedure:            Colonoscopy Indications:          Screening for colorectal malignant neoplasm Providers:            Scot Jun, MD Referring MD:         Marisue Ivan (Referring MD) Medicines:            Propofol per Anesthesia Complications:        No immediate complications. Procedure:            Pre-Anesthesia Assessment:                       - After reviewing the risks and benefits, the patient                        was deemed in satisfactory condition to undergo the                        procedure.                       After obtaining informed consent, the colonoscope was                        passed under direct vision. Throughout the procedure,                        the patient's blood pressure, pulse, and oxygen                        saturations were monitored continuously. The was                        introduced through the anus and advanced to the the                        cecum, identified by appendiceal orifice and ileocecal                        valve. The colonoscopy was performed without                        difficulty. The patient tolerated the procedure well.                        The quality of the bowel preparation was adequate to                        identify polyps. Findings:      A few small-mouthed diverticula were found in the recto-sigmoid colon.      Internal hemorrhoids were found during endoscopy. The hemorrhoids were       small and Grade I (internal hemorrhoids that do not prolapse).      Some small amount of food residue seen scattered in recto sigmoid area.      The exam was otherwise without abnormality. Impression:           -  Diverticulosis in the  recto-sigmoid colon.                       - Internal hemorrhoids.                       - The examination was otherwise normal.                       - No specimens collected. Recommendation:       - Repeat colonoscopy in 10 years for screening purposes. Scot Junobert T Justus Droke, MD 08/13/2018 11:35:10 AM This report has been signed electronically. Number of Addenda: 0 Note Initiated On: 08/13/2018 11:01 AM Scope Withdrawal Time: 0 hours 10 minutes 25 seconds  Total Procedure Duration: 0 hours 23 minutes 45 seconds       Mary Rutan [facility]

## 2018-08-13 NOTE — Anesthesia Procedure Notes (Signed)
Performed by: Cook-Martin, Quavis Klutz Pre-anesthesia Checklist: Patient identified, Emergency Drugs available, Suction available, Patient being monitored and Timeout performed Patient Re-evaluated:Patient Re-evaluated prior to induction Oxygen Delivery Method: Nasal cannula Preoxygenation: Pre-oxygenation with 100% oxygen Induction Type: IV induction Placement Confirmation: positive ETCO2 and CO2 detector       

## 2018-08-13 NOTE — Anesthesia Post-op Follow-up Note (Signed)
Anesthesia QCDR form completed.        

## 2018-08-13 NOTE — Transfer of Care (Signed)
   Immediate Anesthesia Transfer of Care Note  Patient: Danielle Robinson  Procedure(s) Performed: COLONOSCOPY WITH PROPOFOL (N/A )  Patient Location: PACU  Anesthesia Type:General  Level of Consciousness: awake and sedated  Airway & Oxygen Therapy: Patient Spontanous Breathing and Patient connected to nasal cannula oxygen  Post-op Assessment: Report given to RN and Post -op Vital signs reviewed and stable  Post vital signs: Reviewed and stable  Last Vitals:  Vitals Value Taken Time  BP    Temp    Pulse    Resp    SpO2      Last Pain:  Vitals:   08/13/18 1048  TempSrc: Tympanic         Complications: No apparent anesthesia complications

## 2018-08-13 NOTE — Anesthesia Preprocedure Evaluation (Signed)
Anesthesia Evaluation  Patient identified by MRN, date of birth, ID band Patient awake    Reviewed: Allergy & Precautions, H&P , NPO status , Patient's Chart, lab work & pertinent test results, reviewed documented beta blocker date and time   History of Anesthesia Complications (+) history of anesthetic complications  Airway Mallampati: II   Neck ROM: full    Dental  (+) Poor Dentition   Pulmonary neg pulmonary ROS, former smoker,    Pulmonary exam normal        Cardiovascular hypertension, negative cardio ROS Normal cardiovascular exam Rhythm:regular Rate:Normal     Neuro/Psych PSYCHIATRIC DISORDERS Anxiety Depression negative neurological ROS     GI/Hepatic negative GI ROS, Neg liver ROS,   Endo/Other  negative endocrine ROS  Renal/GU negative Renal ROS  negative genitourinary   Musculoskeletal   Abdominal   Peds  Hematology negative hematology ROS (+)   Anesthesia Other Findings Past Medical History: No date: Anxiety No date: Arthritis No date: Atrial fibrillation (HCC) No date: Complication of anesthesia     Comment:  difficulty waking up after foot surgery in 2016 at [facility] No date: Depression No date: H/O active rheumatic fever No date: Hypertension No date: Palpitations No date: Panic attacks Past Surgical History: No date: ABDOMINAL HYSTERECTOMY No date: BILATERAL SALPINGOOPHORECTOMY No date: bilateral varicise vein laser surgery No date: CESAREAN SECTION     Comment:  x 3 No date: COLONOSCOPY No date: FOOT SURGERY 09/28/2015: KNEE ARTHROSCOPY; Right     Comment:  Procedure: ARTHROSCOPY KNEE, MEDIAL AND LATERAL               MENISECTOMY, CHONDROPLASTY;  Surgeon: Donato Heinz, MD;              Location: [facility] ORS;  Service: Orthopedics;  Laterality:               Right; BMI    Body Mass Index:  38.17 kg/m     Reproductive/Obstetrics negative OB ROS                              Anesthesia Physical Anesthesia Plan  ASA: III  Anesthesia Plan: General   Post-op Pain Management:    Induction:   PONV Risk Score and Plan:   Airway Management Planned:   Additional Equipment:   Intra-op Plan:   Post-operative Plan:   Informed Consent: I have reviewed the patients History and Physical, chart, labs and discussed the procedure including the risks, benefits and alternatives for the proposed anesthesia with the patient or authorized representative who has indicated his/her understanding and acceptance.     Dental Advisory Given  Plan Discussed with: CRNA  Anesthesia Plan Comments:         Anesthesia Quick Evaluation

## 2018-08-13 NOTE — H&P (Signed)
Primary Care Physician:  Marisue Ivan, MD Primary Gastroenterologist:  Dr. Mechele Collin  Pre-Procedure History & Physical: HPI:  Danielle Robinson is a 68 y.o. female is here for an colonoscopy.   Past Medical History:  Diagnosis Date  . Anxiety   . Arthritis   . Atrial fibrillation (HCC)   . Complication of anesthesia    difficulty waking up after foot surgery in 2016 at [facility]  . Depression   . H/O active rheumatic fever   . Hypertension   . Palpitations   . Panic attacks     Past Surgical History:  Procedure Laterality Date  . ABDOMINAL HYSTERECTOMY    . BILATERAL SALPINGOOPHORECTOMY    . bilateral varicise vein laser surgery    . CESAREAN SECTION     x 3  . COLONOSCOPY    . FOOT SURGERY    . KNEE ARTHROSCOPY Right 09/28/2015   Procedure: ARTHROSCOPY KNEE, MEDIAL AND LATERAL MENISECTOMY, CHONDROPLASTY;  Surgeon: Donato Heinz, MD;  Location: [facility] ORS;  Service: Orthopedics;  Laterality: Right;    Prior to Admission medications   Medication Sig Start Date End Date Taking? Authorizing Provider  aspirin EC 81 MG tablet Take 81 mg by mouth daily.   Yes [provider]  furosemide (LASIX) 40 MG tablet Take 40 mg by mouth daily as needed.   Yes [provider]  IBUPROFEN IB PO Take by mouth as needed.    Yes [provider]  VERAPAMIL HCL ER, CO, PO Take 40 mg by mouth daily.    Yes [provider]  acetaminophen (TYLENOL) 325 MG tablet Take 650 mg by mouth every 6 (six) hours as needed.    [provider]  Cyanocobalamin (VITAMIN B-12 PO) Take 1,000 mcg by mouth daily.    [provider]  HYDROcodone-acetaminophen (NORCO) 5-325 MG tablet Take 1-2 tablets by mouth every 4 (four) hours as needed for moderate pain. Patient not taking: Reported on 08/13/2018 09/28/15   Donato Heinz, MD  Hydrocodone-Acetaminophen (VICODIN PO) Take by mouth. Reported on 09/28/2015    [provider]  OMEGA-3 FATTY ACIDS PO Take 1,000 mg  by mouth daily.    [provider]    Allergies as of 08/08/2018  . (No Known Allergies)    History reviewed. No pertinent family history.  Social History   Socioeconomic History  . Marital status: Single    Spouse name: Not on file  . Number of children: Not on file  . Years of education: Not on file  . Highest education level: Not on file  Occupational History  . Not on file  Social Needs  . Financial resource strain: Not on file  . Food insecurity:    Worry: Not on file    Inability: Not on file  . Transportation needs:    Medical: Not on file    Non-medical: Not on file  Tobacco Use  . Smoking status: Former Games developer  . Smokeless tobacco: Never Used  Substance and Sexual Activity  . Alcohol use: No    Alcohol/week: 0.0 standard drinks  . Drug use: No  . Sexual activity: Not on file  Lifestyle  . Physical activity:    Days per week: Not on file    Minutes per session: Not on file  . Stress: Not on file  Relationships  . Social connections:    Talks on phone: Not on file    Gets together: Not on file    Attends religious  service: Not on file    Active member of club or organization: Not on file    Attends meetings of clubs or organizations: Not on file    Relationship status: Not on file  . Intimate partner violence:    Fear of current or ex partner: Not on file    Emotionally abused: Not on file    Physically abused: Not on file    Forced sexual activity: Not on file  Other Topics Concern  . Not on file  Social History Narrative  . Not on file    Review of Systems: See HPI, otherwise negative ROS  Physical Exam: BP 140/70   Pulse 65   Temp (!) 96.8 F (36 C) (Tympanic)   Resp 16   Ht 5\' 1"  (1.549 m)   Wt 91.6 kg   SpO2 98%   BMI 38.17 kg/m  General:   Alert,  pleasant and cooperative in NAD Head:  Normocephalic and atraumatic. Neck:  Supple; no masses or thyromegaly. Lungs:  Clear throughout to auscultation.    Heart:  Regular  rate and rhythm. Abdomen:  Soft, nontender and nondistended. Normal bowel sounds, without guarding, and without rebound.   Neurologic:  Alert and  oriented x4;  grossly normal neurologically.  Impression/Plan: Danielle Robinson is here for an colonoscopy to be performed for colon cancer screening.  Risks, benefits, limitations, and alternatives regarding  colonoscopy have been reviewed with the patient.  Questions have been answered.  All parties agreeable.   Lynnae Prude, MD  08/13/2018, 11:00 AM

## 2018-08-15 ENCOUNTER — Encounter: Payer: Self-pay | Admitting: Unknown Physician Specialty

## 2018-08-17 NOTE — Anesthesia Postprocedure Evaluation (Signed)
Anesthesia Post Note  Patient: Danielle Robinson  Procedure(s) Performed: COLONOSCOPY WITH PROPOFOL (N/A )  Patient location during evaluation: PACU Anesthesia Type: General Level of consciousness: awake and alert Pain management: pain level controlled Vital Signs Assessment: post-procedure vital signs reviewed and stable Respiratory status: spontaneous breathing, nonlabored ventilation, respiratory function stable and patient connected to nasal cannula oxygen Cardiovascular status: blood pressure returned to baseline and stable Postop Assessment: no apparent nausea or vomiting Anesthetic complications: no     Last Vitals:  Vitals:   08/13/18 1134 08/13/18 1154  BP: (!) 105/59 120/68  Pulse: 72 62  Resp: 18 18  Temp: (!) 35.7 C   SpO2: 97% 99%    Last Pain:  Vitals:   08/14/18 0741  TempSrc:   PainSc: 0-No pain                 Yevette Edwards

## 2018-09-12 DIAGNOSIS — Z8679 Personal history of other diseases of the circulatory system: Secondary | ICD-10-CM | POA: Insufficient documentation

## 2019-04-09 DIAGNOSIS — I340 Nonrheumatic mitral (valve) insufficiency: Secondary | ICD-10-CM | POA: Insufficient documentation

## 2019-04-14 NOTE — Discharge Instructions (Signed)
°  Instructions after Total Knee Replacement ° ° Aolani Piggott P. Michaeljoseph Revolorio, Jr., M.D.    ° Dept. of Orthopaedics & Sports Medicine ° [facility] ° [address] ° [place], [place]  [address] ° Phone: [phone]   Fax: [phone] ° °  °DIET: °• Drink plenty of non-alcoholic fluids. °• Resume your normal diet. Include foods high in fiber. ° °ACTIVITY:  °• You may use crutches or a walker with weight-bearing as tolerated, unless instructed otherwise. °• You may be weaned off of the walker or crutches by your Physical Therapist.  °• Do NOT place pillows under the knee. Anything placed under the knee could limit your ability to straighten the knee.   °• Continue doing gentle exercises. Exercising will reduce the pain and swelling, increase motion, and prevent muscle weakness.   °• Please continue to use the TED compression stockings for 6 weeks. You may remove the stockings at night, but should reapply them in the morning. °• Do not drive or operate any equipment until instructed. ° °WOUND CARE:  °• Continue to use the PolarCare or ice packs periodically to reduce pain and swelling. °• You may bathe or shower after the staples are removed at the first office visit following surgery. ° °MEDICATIONS: °• You may resume your regular medications. °• Please take the pain medication as prescribed on the medication. °• Do not take pain medication on an empty stomach. °• You have been given a prescription for a blood thinner (Lovenox or Coumadin). Please take the medication as instructed. (NOTE: After completing a 2 week course of Lovenox, take one Enteric-coated aspirin once a day. This along with elevation will help reduce the possibility of phlebitis in your operated leg.) °• Do not drive or drink alcoholic beverages when taking pain medications. ° °CALL THE OFFICE FOR: °• Temperature above 101 degrees °• Excessive bleeding or drainage on the dressing. °• Excessive swelling, coldness, or paleness of the toes. °• Persistent  nausea and vomiting. ° °FOLLOW-UP:  °• You should have an appointment to return to the office in 10-14 days after surgery. °• Arrangements have been made for continuation of Physical Therapy (either home therapy or outpatient therapy). °  °

## 2019-04-23 ENCOUNTER — Other Ambulatory Visit: Payer: Self-pay

## 2019-04-23 ENCOUNTER — Encounter
Admission: RE | Admit: 2019-04-23 | Discharge: 2019-04-23 | Disposition: A | Discharge status: Home / Self Care | Payer: Medicare Other | Source: Ambulatory Visit | Attending: Orthopedic Surgery | Admitting: Orthopedic Surgery

## 2019-04-23 DIAGNOSIS — Z01818 Encounter for other preprocedural examination: Secondary | ICD-10-CM | POA: Diagnosis present

## 2019-04-23 DIAGNOSIS — R001 Bradycardia, unspecified: Secondary | ICD-10-CM | POA: Diagnosis not present

## 2019-04-23 HISTORY — DX: Gastro-esophageal reflux disease without esophagitis: K21.9

## 2019-04-23 HISTORY — DX: Cardiac arrhythmia, unspecified: I49.9

## 2019-04-23 HISTORY — DX: Heart failure, unspecified: I50.9

## 2019-04-23 LAB — URINALYSIS, ROUTINE W REFLEX MICROSCOPIC
Bilirubin Urine: NEGATIVE
Glucose, UA: NEGATIVE mg/dL
Hgb urine dipstick: NEGATIVE
Ketones, ur: NEGATIVE mg/dL
Leukocytes,Ua: NEGATIVE
Nitrite: NEGATIVE
Protein, ur: NEGATIVE mg/dL
Specific Gravity, Urine: 1.014 (ref 1.005–1.030)
pH: 7 (ref 5.0–8.0)

## 2019-04-23 LAB — C-REACTIVE PROTEIN: CRP: <0.8 mg/dL (ref <1.0)

## 2019-04-23 LAB — APTT: aPTT: 29 seconds (ref 24–36)

## 2019-04-23 LAB — PROTIME-INR
INR: 0.9 (ref 0.8–1.2)
Prothrombin Time: 12.4 seconds (ref 11.4–15.2)

## 2019-04-23 LAB — TYPE AND SCREEN
ABO/RH(D): O POS
Antibody Screen: NEGATIVE

## 2019-04-23 LAB — SURGICAL PCR SCREEN
MRSA, PCR: NEGATIVE
Staphylococcus aureus: NEGATIVE

## 2019-04-23 LAB — SEDIMENTATION RATE: Sed Rate: 21 mm/hr (ref 0–30)

## 2019-04-23 MED ORDER — ENSURE PRE-SURGERY PO LIQD
296.0000 mL | Freq: Once | ORAL | Status: DC
  Filled 2019-04-23: qty 296

## 2019-04-23 NOTE — Patient Instructions (Signed)
Your procedure is scheduled on: 05/01/2019 Wed Report to Same Day Surgery 2nd floor medical mall [facility] Entrance-take elevator on left to 2nd floor.  Check in with surgery information desk.) To find out your arrival time please call [phone] between 1PM - 3PM on 04/30/2019 Tues  Remember: Instructions that are not followed completely may result in serious medical risk, up to and including death, or upon the discretion of your surgeon and anesthesiologist your surgery may need to be rescheduled.    _x___ 1. Do not eat food after midnight the night before your procedure. You may drink clear liquids up to 2 hours before you are scheduled to arrive at the hospital for your procedure.  Do not drink clear liquids within 2 hours of your scheduled arrival to the hospital.  Clear liquids include  --Water or Apple juice without pulp  --Clear carbohydrate beverage such as ClearFast or Gatorade  --Black Coffee or Clear Tea (No milk, no creamers, do not add anything to                  the coffee or Tea Type 1 and type 2 diabetics should only drink water.   ____Ensure clear carbohydrate drink on the way to the hospital for bariatric patients  _x___Ensure clear carbohydrate drink 3 hours before surgery. Complete drink 1.5 hours before surgery.   No gum chewing or hard candies.     __x__ 2. No Alcohol for 24 hours before or after surgery.   __x__3. No Smoking or e-cigarettes for 24 prior to surgery.  Do not use any chewable tobacco products for at least 6 hour prior to surgery   ____  4. Bring all medications with you on the day of surgery if instructed.    __x__ 5. Notify your doctor if there is any change in your medical condition     (cold, fever, infections).    x___6. On the morning of surgery brush your teeth with toothpaste and water.  You may rinse your mouth with mouth wash if you wish.  Do not swallow any toothpaste or mouthwash.   Do not wear jewelry, make-up, hairpins,  clips or nail polish.  Do not wear lotions, powders, or perfumes. You may wear deodorant.  Do not shave 48 hours prior to surgery. Men may shave face and neck.  Do not bring valuables to the hospital.    [facility] is not responsible for any belongings or valuables.               Contacts, dentures or bridgework may not be worn into surgery.  Leave your suitcase in the car. After surgery it may be brought to your room.  For patients admitted to the hospital, discharge time is determined by your                       treatment team.  _  Patients discharged the day of surgery will not be allowed to drive home.  You will need someone to drive you home and stay with you the night of your procedure.    Please read over the following fact sheets that you were given:   [facility] Preparing for Surgery and or MRSA Information   _x___ Take anti-hypertensive listed below, cardiac, seizure, asthma,     anti-reflux and psychiatric medicines. These include:  1. VERAPAMIL HCL ER, CO, PO  2.  3.  4.  5.  6.  ____Fleets enema or Magnesium  Citrate as directed.   _x___ Use CHG Soap or sage wipes as directed on instruction sheet   ____ Use inhalers on the day of surgery and bring to hospital day of surgery  ____ Stop Metformin and Janumet 2 days prior to surgery.    ____ Take 1/2 of usual insulin dose the night before surgery and none on the morning     surgery.   _x___ Follow recommendations from Cardiologist, Pulmonologist or PCP regarding          stopping Aspirin, Coumadin, Plavix ,Eliquis, Effient, or Pradaxa, and Pletal.  X____Stop Anti-inflammatories such as Advil, Aleve, Ibuprofen, Motrin, Naproxen, Naprosyn, Goodies powders or aspirin products. OK to take Tylenol and                          Celebrex.   _x___ Stop supplements until after surgery.  But may continue Vitamin D, Vitamin B,       and multivitamin.   ____ Bring C-Pap to the hospital.

## 2019-04-25 LAB — URINE CULTURE
Culture: >=100000 — AB
Special Requests: NORMAL

## 2019-04-25 NOTE — Progress Notes (Signed)
Pre-Admit Testing Provider Notification Note  Provider Notified: Dr. Marry Guan  Notification Mode: Fax  Reason: Abnormal UC Results.  Response: Fax confirmation received.  Additional Information: Placed on chart. Noted on Pre-Admit Worksheet.  Signed: Beulah Gandy, RN

## 2019-04-26 ENCOUNTER — Other Ambulatory Visit
Admission: RE | Admit: 2019-04-26 | Discharge: 2019-04-26 | Disposition: A | Discharge status: Home / Self Care | Payer: Medicare Other | Source: Ambulatory Visit | Attending: Orthopedic Surgery | Admitting: Orthopedic Surgery

## 2019-04-26 ENCOUNTER — Other Ambulatory Visit: Payer: Self-pay

## 2019-04-26 DIAGNOSIS — Z01812 Encounter for preprocedural laboratory examination: Secondary | ICD-10-CM | POA: Insufficient documentation

## 2019-04-26 DIAGNOSIS — Z20828 Contact with and (suspected) exposure to other viral communicable diseases: Secondary | ICD-10-CM | POA: Diagnosis not present

## 2019-04-26 LAB — SARS CORONAVIRUS 2 (TAT 6-24 HRS): SARS Coronavirus 2: NEGATIVE

## 2019-05-01 ENCOUNTER — Other Ambulatory Visit: Payer: Self-pay

## 2019-05-01 ENCOUNTER — Inpatient Hospital Stay: Payer: Medicare Other

## 2019-05-01 ENCOUNTER — Encounter
Admission: RE | Disposition: A | Discharge status: Home Health | Payer: Self-pay | Source: Home / Self Care | Attending: Orthopedic Surgery

## 2019-05-01 ENCOUNTER — Inpatient Hospital Stay: Payer: Medicare Other | Admitting: Anesthesiology

## 2019-05-01 ENCOUNTER — Inpatient Hospital Stay
Admission: RE | Admit: 2019-05-01 | Discharge: 2019-05-03 | DRG: 470 | Disposition: A | Discharge status: Home Health | Payer: Medicare Other | Attending: Orthopedic Surgery | Admitting: Orthopedic Surgery

## 2019-05-01 ENCOUNTER — Encounter: Payer: Self-pay | Admitting: Orthopedic Surgery

## 2019-05-01 DIAGNOSIS — Z79899 Other long term (current) drug therapy: Secondary | ICD-10-CM

## 2019-05-01 DIAGNOSIS — E669 Obesity, unspecified: Secondary | ICD-10-CM | POA: Diagnosis present

## 2019-05-01 DIAGNOSIS — Z6838 Body mass index (BMI) 38.0-38.9, adult: Secondary | ICD-10-CM | POA: Diagnosis not present

## 2019-05-01 DIAGNOSIS — Z7982 Long term (current) use of aspirin: Secondary | ICD-10-CM

## 2019-05-01 DIAGNOSIS — K219 Gastro-esophageal reflux disease without esophagitis: Secondary | ICD-10-CM | POA: Diagnosis present

## 2019-05-01 DIAGNOSIS — F419 Anxiety disorder, unspecified: Secondary | ICD-10-CM | POA: Diagnosis present

## 2019-05-01 DIAGNOSIS — I110 Hypertensive heart disease with heart failure: Secondary | ICD-10-CM | POA: Diagnosis present

## 2019-05-01 DIAGNOSIS — I4891 Unspecified atrial fibrillation: Secondary | ICD-10-CM | POA: Diagnosis present

## 2019-05-01 DIAGNOSIS — M1711 Unilateral primary osteoarthritis, right knee: Principal | ICD-10-CM | POA: Diagnosis present

## 2019-05-01 DIAGNOSIS — Z87891 Personal history of nicotine dependence: Secondary | ICD-10-CM

## 2019-05-01 DIAGNOSIS — F329 Major depressive disorder, single episode, unspecified: Secondary | ICD-10-CM | POA: Diagnosis present

## 2019-05-01 DIAGNOSIS — I509 Heart failure, unspecified: Secondary | ICD-10-CM | POA: Diagnosis present

## 2019-05-01 DIAGNOSIS — Z96659 Presence of unspecified artificial knee joint: Secondary | ICD-10-CM

## 2019-05-01 HISTORY — PX: KNEE ARTHROPLASTY: SHX992

## 2019-05-01 LAB — ABO/RH: ABO/RH(D): O POS

## 2019-05-01 SURGERY — ARTHROPLASTY, KNEE, TOTAL, USING IMAGELESS COMPUTER-ASSISTED NAVIGATION
Anesthesia: Spinal | Site: Knee | Laterality: Right

## 2019-05-01 MED ORDER — PROPOFOL 500 MG/50ML IV EMUL
INTRAVENOUS | Status: DC | PRN
  Administered 2019-05-01: 45 ug/kg/min via INTRAVENOUS

## 2019-05-01 MED ORDER — NEOMYCIN-POLYMYXIN B GU 40-200000 IR SOLN
Status: DC | PRN
  Administered 2019-05-01: 2 mL
  Administered 2019-05-01: 12 mL

## 2019-05-01 MED ORDER — MENTHOL 3 MG MT LOZG
1.0000 | LOZENGE | OROMUCOSAL | Status: DC | PRN
  Filled 2019-05-01: qty 9

## 2019-05-01 MED ORDER — SODIUM CHLORIDE 0.9 % IV SOLN
INTRAVENOUS | Status: DC | PRN
  Administered 2019-05-01: 15:00:00 60 mL

## 2019-05-01 MED ORDER — SENNOSIDES-DOCUSATE SODIUM 8.6-50 MG PO TABS
1.0000 | ORAL_TABLET | Freq: Two times a day (BID) | ORAL | Status: DC
  Administered 2019-05-01 – 2019-05-03 (×4): 1 via ORAL
  Filled 2019-05-01 (×4): qty 1

## 2019-05-01 MED ORDER — ENOXAPARIN SODIUM 30 MG/0.3ML ~~LOC~~ SOLN
30.0000 mg | Freq: Two times a day (BID) | SUBCUTANEOUS | Status: DC
  Administered 2019-05-02 – 2019-05-03 (×3): 30 mg via SUBCUTANEOUS
  Filled 2019-05-01 (×3): qty 0.3

## 2019-05-01 MED ORDER — ONDANSETRON HCL 4 MG/2ML IJ SOLN: 4.0000 mg | Freq: Once | INTRAMUSCULAR | Status: DC | PRN

## 2019-05-01 MED ORDER — FERROUS SULFATE 325 (65 FE) MG PO TABS
325.0000 mg | ORAL_TABLET | Freq: Two times a day (BID) | ORAL | Status: DC
  Administered 2019-05-02 – 2019-05-03 (×3): 325 mg via ORAL
  Filled 2019-05-01 (×3): qty 1

## 2019-05-01 MED ORDER — BUPIVACAINE HCL (PF) 0.5 % IJ SOLN
INTRAMUSCULAR | Status: DC | PRN
  Administered 2019-05-01: 2.5 mL

## 2019-05-01 MED ORDER — BUPIVACAINE HCL (PF) 0.25 % IJ SOLN
INTRAMUSCULAR | Status: DC | PRN
  Administered 2019-05-01: 60 mL

## 2019-05-01 MED ORDER — LIDOCAINE HCL (PF) 2 % IJ SOLN
INTRAMUSCULAR | Status: AC
  Filled 2019-05-01: qty 10

## 2019-05-01 MED ORDER — PHENYLEPHRINE HCL (PRESSORS) 10 MG/ML IV SOLN
INTRAVENOUS | Status: DC | PRN
  Administered 2019-05-01: 100 ug via INTRAVENOUS

## 2019-05-01 MED ORDER — FENTANYL CITRATE (PF) 100 MCG/2ML IJ SOLN
INTRAMUSCULAR | Status: AC
  Filled 2019-05-01: qty 2

## 2019-05-01 MED ORDER — FENTANYL CITRATE (PF) 100 MCG/2ML IJ SOLN
INTRAMUSCULAR | Status: DC | PRN
  Administered 2019-05-01 (×2): 25 ug via INTRAVENOUS
  Administered 2019-05-01: 50 ug via INTRAVENOUS

## 2019-05-01 MED ORDER — PROPOFOL 500 MG/50ML IV EMUL
INTRAVENOUS | Status: AC
  Filled 2019-05-01: qty 50

## 2019-05-01 MED ORDER — FUROSEMIDE 40 MG PO TABS: 40.0000 mg | ORAL_TABLET | Freq: Every day | ORAL | Status: DC | PRN

## 2019-05-01 MED ORDER — METOCLOPRAMIDE HCL 10 MG PO TABS: 5.0000 mg | ORAL_TABLET | Freq: Three times a day (TID) | ORAL | Status: DC | PRN

## 2019-05-01 MED ORDER — FLEET ENEMA 7-19 GM/118ML RE ENEM: 1.0000 | ENEMA | Freq: Once | RECTAL | Status: DC | PRN

## 2019-05-01 MED ORDER — ACETAMINOPHEN 10 MG/ML IV SOLN
INTRAVENOUS | Status: AC
  Filled 2019-05-01: qty 100

## 2019-05-01 MED ORDER — CEFAZOLIN SODIUM-DEXTROSE 2-4 GM/100ML-% IV SOLN: 2.0000 g | INTRAVENOUS | Status: DC

## 2019-05-01 MED ORDER — CEFAZOLIN SODIUM-DEXTROSE 2-3 GM-%(50ML) IV SOLR
INTRAVENOUS | Status: DC | PRN
  Administered 2019-05-01: 2 g via INTRAVENOUS

## 2019-05-01 MED ORDER — CELECOXIB 200 MG PO CAPS
ORAL_CAPSULE | ORAL | Status: AC
  Administered 2019-05-01: 12:00:00 400 mg via ORAL
  Filled 2019-05-01: qty 2

## 2019-05-01 MED ORDER — PHENOL 1.4 % MT LIQD
1.0000 | OROMUCOSAL | Status: DC | PRN
  Filled 2019-05-01: qty 177

## 2019-05-01 MED ORDER — OLOPATADINE HCL 0.7 % OP SOLN: 1.0000 [drp] | Freq: Every day | OPHTHALMIC | Status: DC

## 2019-05-01 MED ORDER — ACETAMINOPHEN 325 MG PO TABS: 325.0000 mg | ORAL_TABLET | Freq: Four times a day (QID) | ORAL | Status: DC | PRN

## 2019-05-01 MED ORDER — FAMOTIDINE 20 MG PO TABS
ORAL_TABLET | ORAL | Status: AC
  Administered 2019-05-01: 12:00:00 20 mg via ORAL
  Filled 2019-05-01: qty 1

## 2019-05-01 MED ORDER — HYDROMORPHONE HCL 1 MG/ML IJ SOLN
0.5000 mg | INTRAMUSCULAR | Status: DC | PRN
  Administered 2019-05-01: 0.5 mg via INTRAVENOUS
  Filled 2019-05-01: qty 1

## 2019-05-01 MED ORDER — MIDAZOLAM HCL 2 MG/2ML IJ SOLN
INTRAMUSCULAR | Status: AC
  Filled 2019-05-01: qty 2

## 2019-05-01 MED ORDER — LACTATED RINGERS IV SOLN
INTRAVENOUS | Status: DC
  Administered 2019-05-01 (×2): via INTRAVENOUS

## 2019-05-01 MED ORDER — PANTOPRAZOLE SODIUM 40 MG PO TBEC
40.0000 mg | DELAYED_RELEASE_TABLET | Freq: Two times a day (BID) | ORAL | Status: DC
  Administered 2019-05-01 – 2019-05-03 (×4): 40 mg via ORAL
  Filled 2019-05-01 (×4): qty 1

## 2019-05-01 MED ORDER — DIPHENHYDRAMINE HCL 12.5 MG/5ML PO ELIX: 12.5000 mg | ORAL_SOLUTION | ORAL | Status: DC | PRN

## 2019-05-01 MED ORDER — CEFAZOLIN SODIUM-DEXTROSE 2-4 GM/100ML-% IV SOLN
INTRAVENOUS | Status: AC
  Filled 2019-05-01: qty 100

## 2019-05-01 MED ORDER — TETRACAINE HCL 1 % IJ SOLN
INTRAMUSCULAR | Status: DC | PRN
  Administered 2019-05-01: 5 mg via INTRASPINAL

## 2019-05-01 MED ORDER — CELECOXIB 200 MG PO CAPS
400.0000 mg | ORAL_CAPSULE | Freq: Once | ORAL | Status: AC
  Administered 2019-05-01: 12:00:00 400 mg via ORAL

## 2019-05-01 MED ORDER — ALUM & MAG HYDROXIDE-SIMETH 200-200-20 MG/5ML PO SUSP: 30.0000 mL | ORAL | Status: DC | PRN

## 2019-05-01 MED ORDER — TRAMADOL HCL 50 MG PO TABS: 50.0000 mg | ORAL_TABLET | ORAL | Status: DC | PRN

## 2019-05-01 MED ORDER — TRANEXAMIC ACID-NACL 1000-0.7 MG/100ML-% IV SOLN
1000.0000 mg | Freq: Once | INTRAVENOUS | Status: AC
  Administered 2019-05-01: 1000 mg via INTRAVENOUS
  Filled 2019-05-01: qty 100

## 2019-05-01 MED ORDER — SODIUM CHLORIDE 0.9 % IV SOLN
INTRAVENOUS | Status: DC
  Administered 2019-05-01: 19:00:00 via INTRAVENOUS

## 2019-05-01 MED ORDER — OXYCODONE HCL 5 MG PO TABS
10.0000 mg | ORAL_TABLET | ORAL | Status: DC | PRN
  Administered 2019-05-02 – 2019-05-03 (×2): 10 mg via ORAL
  Filled 2019-05-01 (×2): qty 2

## 2019-05-01 MED ORDER — BISACODYL 10 MG RE SUPP
10.0000 mg | Freq: Every day | RECTAL | Status: DC | PRN
  Administered 2019-05-03: 11:00:00 10 mg via RECTAL
  Filled 2019-05-01: qty 1

## 2019-05-01 MED ORDER — GABAPENTIN 300 MG PO CAPS
300.0000 mg | ORAL_CAPSULE | Freq: Every day | ORAL | Status: DC
  Administered 2019-05-01 – 2019-05-02 (×2): 300 mg via ORAL
  Filled 2019-05-01 (×2): qty 1

## 2019-05-01 MED ORDER — GLYCOPYRROLATE 0.2 MG/ML IJ SOLN
INTRAMUSCULAR | Status: AC
  Filled 2019-05-01: qty 1

## 2019-05-01 MED ORDER — ONDANSETRON HCL 4 MG PO TABS: 4.0000 mg | ORAL_TABLET | Freq: Four times a day (QID) | ORAL | Status: DC | PRN

## 2019-05-01 MED ORDER — MAGNESIUM HYDROXIDE 400 MG/5ML PO SUSP
30.0000 mL | Freq: Every day | ORAL | Status: DC
  Administered 2019-05-02 – 2019-05-03 (×2): 30 mL via ORAL
  Filled 2019-05-01 (×2): qty 30

## 2019-05-01 MED ORDER — FAMOTIDINE 20 MG PO TABS
20.0000 mg | ORAL_TABLET | Freq: Once | ORAL | Status: AC
  Administered 2019-05-01: 12:00:00 20 mg via ORAL

## 2019-05-01 MED ORDER — TETRACAINE HCL 1 % IJ SOLN
INTRAMUSCULAR | Status: AC
  Filled 2019-05-01: qty 2

## 2019-05-01 MED ORDER — MIDAZOLAM HCL 5 MG/5ML IJ SOLN
INTRAMUSCULAR | Status: DC | PRN
  Administered 2019-05-01: 2 mg via INTRAVENOUS

## 2019-05-01 MED ORDER — GABAPENTIN 300 MG PO CAPS
300.0000 mg | ORAL_CAPSULE | Freq: Once | ORAL | Status: AC
  Administered 2019-05-01: 12:00:00 300 mg via ORAL

## 2019-05-01 MED ORDER — DEXAMETHASONE SODIUM PHOSPHATE 10 MG/ML IJ SOLN
8.0000 mg | Freq: Once | INTRAMUSCULAR | Status: AC
  Administered 2019-05-01: 12:00:00 8 mg via INTRAVENOUS

## 2019-05-01 MED ORDER — PROPOFOL 10 MG/ML IV BOLUS
INTRAVENOUS | Status: DC | PRN
  Administered 2019-05-01 (×4): 18 mg via INTRAVENOUS

## 2019-05-01 MED ORDER — VERAPAMIL HCL 40 MG PO TABS
40.0000 mg | ORAL_TABLET | Freq: Every day | ORAL | Status: DC
  Administered 2019-05-01: 22:00:00 40 mg via ORAL
  Filled 2019-05-01 (×3): qty 1

## 2019-05-01 MED ORDER — CEFAZOLIN SODIUM-DEXTROSE 2-4 GM/100ML-% IV SOLN
2.0000 g | Freq: Four times a day (QID) | INTRAVENOUS | Status: AC
  Administered 2019-05-01 – 2019-05-02 (×4): 2 g via INTRAVENOUS
  Filled 2019-05-01 (×4): qty 100

## 2019-05-01 MED ORDER — ONDANSETRON HCL 4 MG/2ML IJ SOLN: 4.0000 mg | Freq: Four times a day (QID) | INTRAMUSCULAR | Status: DC | PRN

## 2019-05-01 MED ORDER — DEXAMETHASONE SODIUM PHOSPHATE 10 MG/ML IJ SOLN
INTRAMUSCULAR | Status: AC
  Administered 2019-05-01: 8 mg via INTRAVENOUS
  Filled 2019-05-01: qty 1

## 2019-05-01 MED ORDER — CELECOXIB 200 MG PO CAPS
200.0000 mg | ORAL_CAPSULE | Freq: Two times a day (BID) | ORAL | Status: DC
  Administered 2019-05-01 – 2019-05-03 (×4): 200 mg via ORAL
  Filled 2019-05-01 (×4): qty 1

## 2019-05-01 MED ORDER — METOCLOPRAMIDE HCL 5 MG/ML IJ SOLN: 5.0000 mg | Freq: Three times a day (TID) | INTRAMUSCULAR | Status: DC | PRN

## 2019-05-01 MED ORDER — FENTANYL CITRATE (PF) 100 MCG/2ML IJ SOLN: 25.0000 ug | INTRAMUSCULAR | Status: DC | PRN

## 2019-05-01 MED ORDER — GABAPENTIN 300 MG PO CAPS
ORAL_CAPSULE | ORAL | Status: AC
  Administered 2019-05-01: 12:00:00 300 mg via ORAL
  Filled 2019-05-01: qty 1

## 2019-05-01 MED ORDER — CHLORHEXIDINE GLUCONATE 4 % EX LIQD: 60.0000 mL | Freq: Once | CUTANEOUS | Status: DC

## 2019-05-01 MED ORDER — SODIUM CHLORIDE 0.9 % IV SOLN
INTRAVENOUS | Status: DC | PRN
  Administered 2019-05-01: 13:00:00 40 ug/min via INTRAVENOUS

## 2019-05-01 MED ORDER — TRANEXAMIC ACID-NACL 1000-0.7 MG/100ML-% IV SOLN
INTRAVENOUS | Status: DC | PRN
  Administered 2019-05-01: 1000 mg via INTRAVENOUS

## 2019-05-01 MED ORDER — ACETAMINOPHEN 10 MG/ML IV SOLN
INTRAVENOUS | Status: DC | PRN
  Administered 2019-05-01: 1000 mg via INTRAVENOUS

## 2019-05-01 MED ORDER — METOCLOPRAMIDE HCL 10 MG PO TABS
10.0000 mg | ORAL_TABLET | Freq: Three times a day (TID) | ORAL | Status: DC
  Administered 2019-05-01 – 2019-05-03 (×5): 10 mg via ORAL
  Filled 2019-05-01 (×5): qty 1

## 2019-05-01 MED ORDER — TRANEXAMIC ACID-NACL 1000-0.7 MG/100ML-% IV SOLN
1000.0000 mg | INTRAVENOUS | Status: DC
  Filled 2019-05-01: qty 100

## 2019-05-01 MED ORDER — ACETAMINOPHEN 10 MG/ML IV SOLN
1000.0000 mg | Freq: Four times a day (QID) | INTRAVENOUS | Status: AC
  Administered 2019-05-01 – 2019-05-02 (×4): 1000 mg via INTRAVENOUS
  Filled 2019-05-01 (×4): qty 100

## 2019-05-01 MED ORDER — OXYCODONE HCL 5 MG PO TABS
5.0000 mg | ORAL_TABLET | ORAL | Status: DC | PRN
  Filled 2019-05-01 (×2): qty 1

## 2019-05-01 SURGICAL SUPPLY — 74 items
ATTUNE PSFEM RTSZ6 NARCEM KNEE (Femur) ×2 IMPLANT
ATTUNE PSRP INSR SZ6 5 KNEE (Insert) ×1 IMPLANT
ATTUNE PSRP INSR SZ6 5MM KNEE (Insert) ×1 IMPLANT
BASE TIBIA ATTUNE KNEE SYS SZ6 (Knees) IMPLANT
BATTERY INSTRU NAVIGATION (MISCELLANEOUS) ×12 IMPLANT
BLADE SAW 70X12.5 (BLADE) ×3 IMPLANT
BLADE SAW 90X13X1.19 OSCILLAT (BLADE) ×3 IMPLANT
BLADE SAW 90X25X1.19 OSCILLAT (BLADE) ×3 IMPLANT
CANISTER SUCT 3000ML PPV (MISCELLANEOUS) ×3 IMPLANT
CEMENT HV SMART SET (Cement) ×6 IMPLANT
COOLER POLAR GLACIER W/PUMP (MISCELLANEOUS) ×3 IMPLANT
COVER WAND RF STERILE (DRAPES) ×3 IMPLANT
CUFF TOURN SGL QUICK 30 (TOURNIQUET CUFF) ×2
CUFF TRNQT CYL 30X4X21-28X (TOURNIQUET CUFF) IMPLANT
DRAPE 3/4 80X56 (DRAPES) ×3 IMPLANT
DRSG DERMACEA 8X12 NADH (GAUZE/BANDAGES/DRESSINGS) ×3 IMPLANT
DRSG OPSITE POSTOP 4X14 (GAUZE/BANDAGES/DRESSINGS) ×3 IMPLANT
DRSG TEGADERM 4X4.75 (GAUZE/BANDAGES/DRESSINGS) ×3 IMPLANT
DURAPREP 26ML APPLICATOR (WOUND CARE) ×6 IMPLANT
ELECT REM PT RETURN 9FT ADLT (ELECTROSURGICAL) ×3
ELECTRODE REM PT RTRN 9FT ADLT (ELECTROSURGICAL) ×1 IMPLANT
EX-PIN ORTHOLOCK NAV 4X150 (PIN) ×6 IMPLANT
GAUZE SPONGE 4X4 12PLY STRL (GAUZE/BANDAGES/DRESSINGS) ×2 IMPLANT
GLOVE BIO SURGEON STRL SZ7.5 (GLOVE) ×6 IMPLANT
GLOVE BIOGEL M STRL SZ7.5 (GLOVE) ×6 IMPLANT
GLOVE BIOGEL PI IND STRL 7.5 (GLOVE) ×1 IMPLANT
GLOVE BIOGEL PI INDICATOR 7.5 (GLOVE) ×2
GLOVE INDICATOR 8.0 STRL GRN (GLOVE) ×3 IMPLANT
GOWN STRL REUS W/ TWL LRG LVL3 (GOWN DISPOSABLE) ×2 IMPLANT
GOWN STRL REUS W/ TWL XL LVL3 (GOWN DISPOSABLE) ×1 IMPLANT
GOWN STRL REUS W/TWL LRG LVL3 (GOWN DISPOSABLE) ×4
GOWN STRL REUS W/TWL XL LVL3 (GOWN DISPOSABLE) ×2
HEMOVAC 400CC 10FR (MISCELLANEOUS) ×3 IMPLANT
HOLDER FOLEY CATH W/STRAP (MISCELLANEOUS) ×3 IMPLANT
HOOD PEEL AWAY FLYTE STAYCOOL (MISCELLANEOUS) ×6 IMPLANT
KIT TURNOVER KIT A (KITS) ×3 IMPLANT
KNIFE SCULPS 14X20 (INSTRUMENTS) ×3 IMPLANT
LABEL OR SOLS (LABEL) ×3 IMPLANT
MANIFOLD NEPTUNE II (INSTRUMENTS) ×3 IMPLANT
NDL SAFETY ECLIPSE 18X1.5 (NEEDLE) ×1 IMPLANT
NDL SPNL 20GX3.5 QUINCKE YW (NEEDLE) ×2 IMPLANT
NEEDLE HYPO 18GX1.5 SHARP (NEEDLE) ×2
NEEDLE SPNL 20GX3.5 QUINCKE YW (NEEDLE) ×6 IMPLANT
NS IRRIG 500ML POUR BTL (IV SOLUTION) ×3 IMPLANT
PACK TOTAL KNEE (MISCELLANEOUS) ×3 IMPLANT
PAD ABD DERMACEA PRESS 5X9 (GAUZE/BANDAGES/DRESSINGS) ×2 IMPLANT
PAD WRAPON POLAR KNEE (MISCELLANEOUS) ×1 IMPLANT
PATELLA MEDIAL ATTUN 35MM KNEE (Knees) ×2 IMPLANT
PENCIL SMOKE ULTRAEVAC 22 CON (MISCELLANEOUS) ×3 IMPLANT
PIN DRILL QUICK PACK ×3 IMPLANT
PIN FIXATION 1/8DIA X 3INL (PIN) ×9 IMPLANT
PULSAVAC PLUS IRRIG FAN TIP (DISPOSABLE) ×3
SOL .9 NS 3000ML IRR  AL (IV SOLUTION) ×2
SOL .9 NS 3000ML IRR UROMATIC (IV SOLUTION) ×1 IMPLANT
SOL PREP PVP 2OZ (MISCELLANEOUS) ×3
SOLUTION PREP PVP 2OZ (MISCELLANEOUS) ×1 IMPLANT
SPONGE DRAIN TRACH 4X4 STRL 2S (GAUZE/BANDAGES/DRESSINGS) ×3 IMPLANT
STAPLER SKIN PROX 35W (STAPLE) ×3 IMPLANT
STOCKINETTE BIAS CUT 6 980064 (GAUZE/BANDAGES/DRESSINGS) ×2 IMPLANT
STOCKINETTE IMPERV 14X48 (MISCELLANEOUS) IMPLANT
STRAP TIBIA SHORT (MISCELLANEOUS) ×3 IMPLANT
SUCTION FRAZIER HANDLE 10FR (MISCELLANEOUS) ×2
SUCTION TUBE FRAZIER 10FR DISP (MISCELLANEOUS) ×1 IMPLANT
SUT VIC AB 0 CT1 36 (SUTURE) ×5 IMPLANT
SUT VIC AB 1 CT1 36 (SUTURE) ×6 IMPLANT
SUT VIC AB 2-0 CT2 27 (SUTURE) ×3 IMPLANT
SYR 20ML LL LF (SYRINGE) ×3 IMPLANT
SYR 30ML LL (SYRINGE) ×6 IMPLANT
TIBIA ATTUNE KNEE SYS BASE SZ6 (Knees) ×3 IMPLANT
TIP FAN IRRIG PULSAVAC PLUS (DISPOSABLE) ×1 IMPLANT
TOWEL OR 17X26 4PK STRL BLUE (TOWEL DISPOSABLE) ×3 IMPLANT
TOWER CARTRIDGE SMART MIX (DISPOSABLE) ×3 IMPLANT
TRAY FOLEY MTR SLVR 16FR STAT (SET/KITS/TRAYS/PACK) ×3 IMPLANT
WRAPON POLAR PAD KNEE (MISCELLANEOUS) ×3

## 2019-05-01 NOTE — H&P (Signed)
The patient has been re-examined, and the chart reviewed, and there have been no interval changes to the documented history and physical.    The risks, benefits, and alternatives have been discussed at length. The patient expressed understanding of the risks benefits and agreed with plans for surgical intervention.  Shauni Henner P. Tymeer Vaquera, Jr. M.D.    

## 2019-05-01 NOTE — Anesthesia Preprocedure Evaluation (Signed)
Anesthesia Evaluation  Patient identified by MRN, date of birth, ID band Patient awake    Reviewed: Allergy & Precautions, H&P , NPO status , Patient's Chart, lab work & pertinent test results, reviewed documented beta blocker date and time   History of Anesthesia Complications (+) PROLONGED EMERGENCE and history of anesthetic complications  Airway Mallampati: II   Neck ROM: full    Dental  (+) Poor Dentition   Pulmonary neg pulmonary ROS, former smoker,    Pulmonary exam normal        Cardiovascular hypertension, (-) angina+CHF  (-) Past MI Normal cardiovascular exam+ dysrhythmias Atrial Fibrillation (-) Valvular Problems/Murmurs     Neuro/Psych PSYCHIATRIC DISORDERS Anxiety Depression negative neurological ROS     GI/Hepatic Neg liver ROS, GERD  ,  Endo/Other  negative endocrine ROS  Renal/GU negative Renal ROS  negative genitourinary   Musculoskeletal   Abdominal   Peds  Hematology negative hematology ROS (+)   Anesthesia Other Findings Past Medical History: No date: Anxiety No date: Arthritis No date: Atrial fibrillation ([place]) No date: Complication of anesthesia     Comment:  difficulty waking up after foot surgery in 2016 at [facility] No date: Depression No date: H/O active rheumatic fever No date: Hypertension No date: Palpitations No date: Panic attacks Past Surgical History: No date: ABDOMINAL HYSTERECTOMY No date: BILATERAL SALPINGOOPHORECTOMY No date: bilateral varicise vein laser surgery No date: CESAREAN SECTION     Comment:  x 3 No date: COLONOSCOPY No date: FOOT SURGERY 09/28/2015: KNEE ARTHROSCOPY; Right     Comment:  Procedure: ARTHROSCOPY KNEE, MEDIAL AND LATERAL               MENISECTOMY, CHONDROPLASTY;  Surgeon: Dereck Leep, MD;              Location: [facility] ORS;  Service: Orthopedics;  Laterality:               Right; BMI    Body Mass Index:  38.17 kg/m      Reproductive/Obstetrics negative OB ROS                             Anesthesia Physical  Anesthesia Plan  ASA: III  Anesthesia Plan: Spinal   Post-op Pain Management:    Induction: Intravenous  PONV Risk Score and Plan: 2 and Propofol infusion and TIVA  Airway Management Planned: Natural Airway and Simple Face Mask  Additional Equipment:   Intra-op Plan:   Post-operative Plan:   Informed Consent: I have reviewed the patients History and Physical, chart, labs and discussed the procedure including the risks, benefits and alternatives for the proposed anesthesia with the patient or authorized representative who has indicated his/her understanding and acceptance.     Dental Advisory Given  Plan Discussed with: CRNA  Anesthesia Plan Comments:         Anesthesia Quick Evaluation

## 2019-05-01 NOTE — Anesthesia Post-op Follow-up Note (Signed)
Anesthesia QCDR form completed.        

## 2019-05-01 NOTE — Transfer of Care (Signed)
Immediate Anesthesia Transfer of Care Note  Patient: Danielle Robinson  Procedure(s) Performed: COMPUTER ASSISTED TOTAL KNEE ARTHROPLASTY (Right Knee)  Patient Location: PACU  Anesthesia Type:Spinal  Level of Consciousness: awake, alert  and oriented  Airway & Oxygen Therapy: Patient Spontanous Breathing  Post-op Assessment: Report given to RN and Post -op Vital signs reviewed and stable  Post vital signs: Reviewed and stable  Last Vitals:  Vitals Value Taken Time  BP 126/70 05/01/19 1625  Temp    Pulse 80 05/01/19 1627  Resp 18 05/01/19 1627  SpO2 98 % 05/01/19 1627  Vitals shown include unvalidated device data.  Last Pain:  Vitals:   05/01/19 1119  TempSrc: Tympanic  PainSc: 3          Complications: No apparent anesthesia complications

## 2019-05-01 NOTE — Op Note (Signed)
OPERATIVE NOTE  DATE OF SURGERY:  05/01/2019  PATIENT NAME:  Danielle Robinson   DOB: [birth year]  MRN: [id number]  PRE-OPERATIVE DIAGNOSIS: Degenerative arthrosis of the right knee, primary  POST-OPERATIVE DIAGNOSIS:  Same  PROCEDURE:  Right total knee arthroplasty using computer-assisted navigation  SURGEON:  Marciano Sequin. M.D.  ASSISTANT: Cassell Smiles, PA-C (present and scrubbed throughout the case, critical for assistance with exposure, retraction, instrumentation, and closure)  ANESTHESIA: spinal  ESTIMATED BLOOD LOSS: 50 mL  FLUIDS REPLACED: 1100 mL of crystalloid  TOURNIQUET TIME: 93 minutes  DRAINS: 2 medium Hemovac drains  SOFT TISSUE RELEASES: Anterior cruciate ligament, posterior cruciate ligament, deep medial collateral ligament, patellofemoral ligament  IMPLANTS UTILIZED: DePuy Attune size 6N posterior stabilized femoral component (cemented), size 6 rotating platform tibial component (cemented), 35 mm medialized dome patella (cemented), and a 5 mm stabilized rotating platform polyethylene insert.  INDICATIONS FOR SURGERY: Danielle Robinson is a 68 y.o. year old female with a long history of progressive knee pain. X-rays demonstrated severe degenerative changes in tricompartmental fashion. The patient had not seen any significant improvement despite conservative nonsurgical intervention. After discussion of the risks and benefits of surgical intervention, the patient expressed understanding of the risks benefits and agree with plans for total knee arthroplasty.   The risks, benefits, and alternatives were discussed at length including but not limited to the risks of infection, bleeding, nerve injury, stiffness, blood clots, the need for revision surgery, cardiopulmonary complications, among others, and they were willing to proceed.  PROCEDURE IN DETAIL: The patient was brought into the operating room and, after adequate spinal anesthesia was achieved, a tourniquet was placed on  the patient's upper thigh. The patient's knee and leg were cleaned and prepped with alcohol and DuraPrep and draped in the usual sterile fashion. A "timeout" was performed as per usual protocol. The lower extremity was exsanguinated using an Esmarch, and the tourniquet was inflated to 300 mmHg. An anterior longitudinal incision was made followed by a standard mid vastus approach. The deep fibers of the medial collateral ligament were elevated in a subperiosteal fashion off of the medial flare of the tibia so as to maintain a continuous soft tissue sleeve. The patella was subluxed laterally and the patellofemoral ligament was incised. Inspection of the knee demonstrated severe degenerative changes with full-thickness loss of articular cartilage. Osteophytes were debrided using a rongeur. Anterior and posterior cruciate ligaments were excised. Two 4.0 mm Schanz pins were inserted in the femur and into the tibia for attachment of the array of trackers used for computer-assisted navigation. Hip center was identified using a circumduction technique. Distal landmarks were mapped using the computer. The distal femur and proximal tibia were mapped using the computer. The distal femoral cutting guide was positioned using computer-assisted navigation so as to achieve a 5 distal valgus cut. The femur was sized and it was felt that a size 6N femoral component was appropriate. A size 6 femoral cutting guide was positioned and the anterior cut was performed and verified using the computer. This was followed by completion of the posterior and chamfer cuts. Femoral cutting guide for the central box was then positioned in the center box cut was performed.  Attention was then directed to the proximal tibia. Medial and lateral menisci were excised. The extramedullary tibial cutting guide was positioned using computer-assisted navigation so as to achieve a 0 varus-valgus alignment and 3 posterior slope. The cut was performed and  verified using the computer. The proximal tibia was sized and  it was felt that a size 6 tibial tray was appropriate. Tibial and femoral trials were inserted followed by insertion of a 5 mm polyethylene insert. This allowed for excellent mediolateral soft tissue balancing both in flexion and in full extension. Finally, the patella was cut and prepared so as to accommodate a 35 mm medialized dome patella. A patella trial was placed and the knee was placed through a range of motion with excellent patellar tracking appreciated. The femoral trial was removed after debridement of posterior osteophytes. The central post-hole for the tibial component was reamed followed by insertion of a keel punch. Tibial trials were then removed. Cut surfaces of bone were irrigated with copious amounts of normal saline with antibiotic solution using pulsatile lavage and then suctioned dry. Polymethylmethacrylate cement was prepared in the usual fashion using a vacuum mixer. Cement was applied to the cut surface of the proximal tibia as well as along the undersurface of a size 6 rotating platform tibial component. Tibial component was positioned and impacted into place. Excess cement was removed using Personal assistant. Cement was then applied to the cut surfaces of the femur as well as along the posterior flanges of the size 6N femoral component. The femoral component was positioned and impacted into place. Excess cement was removed using Personal assistant. A 5 mm polyethylene trial was inserted and the knee was brought into full extension with steady axial compression applied. Finally, cement was applied to the backside of a 35 mm medialized dome patella and the patellar component was positioned and patellar clamp applied. Excess cement was removed using Personal assistant. After adequate curing of the cement, the tourniquet was deflated after a total tourniquet time of 93 minutes. Hemostasis was achieved using electrocautery. The knee was  irrigated with copious amounts of normal saline with antibiotic solution using pulsatile lavage and then suctioned dry. 20 mL of 1.3% Exparel and 60 mL of 0.25% Marcaine in 40 mL of normal saline was injected along the posterior capsule, medial and lateral gutters, and along the arthrotomy site. A 5 mm stabilized rotating platform polyethylene insert was inserted and the knee was placed through a range of motion with excellent mediolateral soft tissue balancing appreciated and excellent patellar tracking noted. 2 medium drains were placed in the wound bed and brought out through separate stab incisions. The medial parapatellar portion of the incision was reapproximated using interrupted sutures of #1 Vicryl. Subcutaneous tissue was approximated in layers using first #0 Vicryl followed #2-0 Vicryl. The skin was approximated with skin staples. A sterile dressing was applied.  The patient tolerated the procedure well and was transported to the recovery room in stable condition.    Danielle Robinson P. Angie Fava., M.D.

## 2019-05-01 NOTE — Anesthesia Procedure Notes (Signed)
Spinal  Patient location during procedure: OR Start time: 05/01/2019 12:44 PM End time: 05/01/2019 12:47 PM Staffing Resident/CRNA: Bernardo Heater, CRNA Performed: resident/CRNA  Preanesthetic Checklist Completed: patient identified, site marked, surgical consent, pre-op evaluation, timeout performed, IV checked, risks and benefits discussed and monitors and equipment checked Spinal Block Patient position: sitting Prep: ChloraPrep Patient monitoring: heart rate, continuous pulse ox, blood pressure and cardiac monitor Approach: midline Location: L3-4 Injection technique: single-shot Needle Needle type: Introducer and Pencan  Needle gauge: 24 G Needle length: 9 cm Additional Notes Negative paresthesia. Negative blood return. Positive free-flowing CSF. Expiration date of kit checked and confirmed. Patient tolerated procedure well, without complications.

## 2019-05-02 ENCOUNTER — Encounter: Payer: Self-pay | Admitting: Orthopedic Surgery

## 2019-05-02 MED ORDER — SODIUM CHLORIDE 0.9 % IV BOLUS
500.0000 mL | Freq: Once | INTRAVENOUS | Status: AC
  Administered 2019-05-02: 15:00:00 500 mL via INTRAVENOUS

## 2019-05-02 NOTE — Anesthesia Postprocedure Evaluation (Signed)
Anesthesia Post Note  Patient: Danielle Robinson  Procedure(s) Performed: COMPUTER ASSISTED TOTAL KNEE ARTHROPLASTY (Right Knee)  Patient location during evaluation: Nursing Unit Anesthesia Type: Spinal Level of consciousness: oriented and awake and alert Pain management: pain level controlled Vital Signs Assessment: post-procedure vital signs reviewed and stable Respiratory status: spontaneous breathing and respiratory function stable Cardiovascular status: blood pressure returned to baseline and stable Postop Assessment: no headache, no backache, no apparent nausea or vomiting, patient able to bend at knees and able to ambulate Anesthetic complications: no     Last Vitals:  Vitals:   05/02/19 0447 05/02/19 0731  BP: 119/62 122/62  Pulse: (!) 55 (!) 56  Resp: 18 16  Temp: 36.7 C 36.4 C  SpO2: 98% 98%    Last Pain:  Vitals:   05/02/19 0731  TempSrc: Oral  PainSc:                  Caryl Asp

## 2019-05-02 NOTE — Evaluation (Signed)
Occupational Therapy Evaluation Patient Details Name: Danielle Robinson MRN: [id number] DOB: 09/23/1950 Today's Date: 05/02/2019    History of Present Illness 68 y/o female s/p R TKA 05/01/19.   Clinical Impression   Pt seen for OT evaluation this date, POD#1 from above surgery. Pt was independent in all ADL prior to surgery and is eager to return to PLOF with less pain and improved safety and independence. Son present for session, very supportive and confirms pt will have all the help she needs at home from him and his wife. Pt currently requires minimal assist for LB dressing while in seated position due to pain and limited AROM of R knee. Pt/son instructed in polar care mgt, falls prevention strategies, home/routines modifications, DME/AE for LB bathing and dressing tasks, and compression stocking mgt. Handout provided. Pt would benefit from skilled OT services including additional instruction in dressing techniques with or without assistive devices for dressing and bathing skills to support recall and carryover prior to discharge and ultimately to maximize safety, independence, and minimize falls risk and caregiver burden. Do not currently anticipate any OT needs following this hospitalization. Reached out to [facility] per son's request to facilitate planning services needed after hospitalization while the son is present. RNCM notified per secure chat.     Follow Up Recommendations  No OT follow up    Equipment Recommendations  3 in 1 bedside commode;Other (comment)(reacher)    Recommendations for Other Services       Precautions / Restrictions Precautions Precautions: Fall Restrictions Weight Bearing Restrictions: Yes RLE Weight Bearing: Weight bearing as tolerated      Mobility Bed Mobility Overal bed mobility: Modified Independent             General bed mobility comments: deferred, up in recliner  Transfers Overall transfer level: Modified independent Equipment used: Rolling  walker (2 wheeled)             General transfer comment: Pt did not need phyiscal assist, cuing for positioning and hand placement.    Balance Overall balance assessment: Modified Independent                                         ADL either performed or assessed with clinical judgement   ADL Overall ADL's : Needs assistance/impaired                                       General ADL Comments: Pt requires Min A for LB ADL, CGA for functional mobility with RW; family able to provide needed level of assist     Vision Baseline Vision/History: No visual deficits Patient Visual Report: No change from baseline       Perception     Praxis      Pertinent Vitals/Pain Pain Assessment: 0-10 Pain Score: 6      Hand Dominance Right   Extremity/Trunk Assessment Upper Extremity Assessment Upper Extremity Assessment: Overall WFL for tasks assessed   Lower Extremity Assessment Lower Extremity Assessment: RLE deficits/detail RLE Deficits / Details: expected post op weakness, able to SLR       Communication Communication Communication: No difficulties   Cognition Arousal/Alertness: Awake/alert Behavior During Therapy: WFL for tasks assessed/performed Overall Cognitive Status: Within Functional Limits for tasks assessed  General Comments       Exercises Other Exercises: Pt/son instructed in compression stocking mgt, polar care mgt, falls prevention, home/routines modifications, AE/DME; handout provided   Shoulder Instructions      Home Living Family/patient expects to be discharged to:: Private residence Living Arrangements: Spouse/significant other;Children Available Help at Discharge: Family;Available 24 hours/day Type of Home: House Home Access: Stairs to enter [employer] of Steps: 5 Entrance Stairs-Rails: Left;Right(too wide to use both) Home Layout: Two level;Able  to live on main level with bedroom/bathroom Alternate Level Stairs-Number of Steps: 17 Alternate Level Stairs-Rails: (first half R, then L only) Bathroom Shower/Tub: Chief Strategy Officer: Standard     Home Equipment: None(may have a walker, if so not sure if it has wheels)          Prior Functioning/Environment Level of Independence: Independent                 OT Problem List: Pain;Decreased range of motion;Decreased strength      OT Treatment/Interventions: Self-care/ADL training;Therapeutic exercise;Therapeutic activities;DME and/or AE instruction;Patient/family education    OT Goals(Current goals can be found in the care plan section) Acute Rehab OT Goals Patient Stated Goal: go home OT Goal Formulation: With patient/family Time For Goal Achievement: 05/16/19 Potential to Achieve Goals: Good ADL Goals Pt Will Perform Lower Body Dressing: sit to/from stand;with caregiver independent in assisting Pt Will Transfer to Toilet: with supervision;ambulating;bedside commode(BSC over commode if needed, LRAD for amb) Additional ADL Goal #1: Pt will independently instruct family/caregiver in polar care mgt Additional ADL Goal #2: Pt will independently instruct family/caregiver in compression stocking mgt  OT Frequency: Min 1X/week   Barriers to D/C:            Co-evaluation              AM-PAC OT "6 Clicks" Daily Activity     Outcome Measure Help from another person eating meals?: None Help from another person taking care of personal grooming?: None Help from another person toileting, which includes using toliet, bedpan, or urinal?: A Little Help from another person bathing (including washing, rinsing, drying)?: A Little Help from another person to put on and taking off regular upper body clothing?: None Help from another person to put on and taking off regular lower body clothing?: A Little 6 Click Score: 21   End of Session    Activity Tolerance:  Patient tolerated treatment well Patient left: in chair;with call bell/phone within reach;with chair alarm set;with SCD's reapplied;Other (comment)(polar care in place)  OT Visit Diagnosis: Other abnormalities of gait and mobility (R26.89);Pain Pain - Right/Left: Right Pain - part of body: Knee                Time: 8295-6213 OT Time Calculation (min): 19 min Charges:  OT General Charges $OT Visit: 1 Visit OT Evaluation $OT Eval Low Complexity: 1 Low OT Treatments $Self Care/Home Management : 8-22 mins  Richrd Prime, MPH, MS, OTR/L ascom [phone] 05/02/19, 11:53 AM

## 2019-05-02 NOTE — Evaluation (Signed)
Physical Therapy Evaluation Patient Details Name: Danielle Robinson MRN: [id number] DOB: 1951-03-27 Today's Date: 05/02/2019   History of Present Illness  68 y/o female s/p R TKA 10/21.  Clinical Impression  Pt did well with PT and despite some minimal hesitation at times she ultimately did very well and has achieved typical POD1 goals with relative ease.  She did not have excessive pain increase with activity and was able to walk safely and with confident with walker and able to maintain consistent cadence and speed.  She was able to do SLRs w/o much warm up, did have pain/stiffness with ROM tasks but still had 0-73 degrees after multiple reps.  Pt overall doing well and as expected.    Follow Up Recommendations Home health PT    Equipment Recommendations  Rolling walker with 5" wheels;3in1 (PT)    Recommendations for Other Services       Precautions / Restrictions Precautions Precautions: Fall Restrictions Weight Bearing Restrictions: Yes RLE Weight Bearing: Weight bearing as tolerated      Mobility  Bed Mobility Overal bed mobility: Modified Independent             General bed mobility comments: Pt able to get to sitting EOB w/o phyiscal assist, minimal cuing/encouragement  Transfers Overall transfer level: Modified independent Equipment used: Rolling walker (2 wheeled)             General transfer comment: Pt did not need phyiscal assist, cuing for positioning and hand placement.  Ambulation/Gait Ambulation/Gait assistance: Min guard Gait Distance (Feet): 100 Feet Assistive device: Rolling walker (2 wheeled)       General Gait Details: Pt initially unsure of how she would do, but quickly gained confidence and after intial goal from PT to go at least 50 ft she was able to maintain consistent cadence and speed and pushed herself to do ~100 ft with no safety issues or excessive fatigue.  Stairs            Wheelchair Mobility    Modified Rankin (Stroke  Patients Only)       Balance Overall balance assessment: Modified Independent                                           Pertinent Vitals/Pain Pain Assessment: 0-10 Pain Score: 6     Home Living Family/patient expects to be discharged to:: Private residence Living Arrangements: Spouse/significant other;Children Available Help at Discharge: Family;Available 24 hours/day Type of Home: House Home Access: Stairs to enter Entrance Stairs-Rails: Left;Right(too wide to use both) Entrance Stairs-Number of Steps: 5 Home Layout: Two level;Able to live on main level with bedroom/bathroom Home Equipment: (may have a walker, if so not sure if it has wheels)      Prior Function Level of Independence: Independent               Hand Dominance        Extremity/Trunk Assessment   Upper Extremity Assessment Upper Extremity Assessment: Overall WFL for tasks assessed    Lower Extremity Assessment Lower Extremity Assessment: (expected post op weakness, able to SLR)       Communication   Communication: No difficulties  Cognition Arousal/Alertness: Awake/alert Behavior During Therapy: WFL for tasks assessed/performed Overall Cognitive Status: Within Functional Limits for tasks assessed  General Comments      Exercises Total Joint Exercises Ankle Circles/Pumps: AROM;10 reps Quad Sets: Strengthening;15 reps Heel Slides: AROM;AAROM;10 reps Hip ABduction/ADduction: Strengthening;10 reps Straight Leg Raises: AROM;10 reps Knee Flexion: PROM;5 reps Goniometric ROM: 0-73   Assessment/Plan    PT Assessment Patient needs continued PT services  PT Problem List Decreased strength;Decreased range of motion;Decreased activity tolerance;Decreased balance;Decreased mobility;Decreased knowledge of use of DME;Decreased safety awareness;Pain       PT Treatment Interventions DME instruction;Gait training;Stair  training;Functional mobility training;Therapeutic activities;Therapeutic exercise;Balance training;Neuromuscular re-education;Patient/family education    PT Goals (Current goals can be found in the Care Plan section)  Acute Rehab PT Goals Patient Stated Goal: do as well as she can with rehab PT Goal Formulation: With patient Time For Goal Achievement: 05/16/19 Potential to Achieve Goals: Good    Frequency BID   Barriers to discharge        Co-evaluation               AM-PAC PT "6 Clicks" Mobility  Outcome Measure Help needed turning from your back to your side while in a flat bed without using bedrails?: None Help needed moving from lying on your back to sitting on the side of a flat bed without using bedrails?: None Help needed moving to and from a bed to a chair (including a wheelchair)?: None Help needed standing up from a chair using your arms (e.g., wheelchair or bedside chair)?: None Help needed to walk in hospital room?: A Little Help needed climbing 3-5 steps with a railing? : A Little 6 Click Score: 22    End of Session Equipment Utilized During Treatment: Gait belt Activity Tolerance: Patient tolerated treatment well Patient left: with call bell/phone within reach;with chair alarm set Nurse Communication: Mobility status PT Visit Diagnosis: Muscle weakness (generalized) (M62.81);Difficulty in walking, not elsewhere classified (R26.2);Pain Pain - Right/Left: Right Pain - part of body: Knee    Time: 0910-0957 PT Time Calculation (min) (ACUTE ONLY): 47 min   Charges:   PT Evaluation $PT Eval Low Complexity: 1 Low PT Treatments $Gait Training: 8-22 mins $Therapeutic Exercise: 8-22 mins        Malachi Pro, DPT 05/02/2019, 10:20 AM

## 2019-05-02 NOTE — TOC Initial Note (Addendum)
Transition of Care [facility]) - Initial/Assessment Note    Patient Details  Name: Danielle Robinson MRN: [id number] Date of Birth: 11-16-1950  [facility]) CM/SW Contact:    Audreana Hancox, Lenice Llamas Phone Number: [phone]  05/02/2019, 1:45 PM  Clinical Narrative: PT is recommending home health. Clinical Education officer, museum (CSW) met with patient and her son Danielle Robinson was at bedside. Patient was alert and oriented X4 and was sitting in the char. CSW introduced self and explained role of CSW department. Per patient she lives in [place] with her husband, son and daughter in [place]. CSW explained home health process and provided CMS home health list. CSW explained that patient's surgeon's office prefers [facility]. Patient and son are agreeable to [facility] home [facility]. Per Helene Kelp [facility] representative they can accept patient. Patient needs a rolling walker and bedside commode. [employer] DME agency representative is aware of above. Patient and her son are agreeable to Lovenox injections. CSW will notify patient of her Lovenox price once available. CSW will continue to follow and assist as needed.     4:12 pm: CSW made patient and her son aware that price of Lovenox is $3.      Expected Discharge Plan: [place] Barriers to Discharge: Continued Medical Work up   Patient Goals and CMS Choice Patient states their goals for this hospitalization and ongoing recovery are:: To go home. CMS Medicare.gov Compare Post Acute Care list provided to:: Patient Choice offered to / list presented to : Patient  Expected Discharge Plan and Services Expected Discharge Plan: [place] In-house Referral: Clinical Social Work   Post Acute Care Choice: [place] arrangements for the past 2 months: [place]                 DME Arranged: Bedside commode, Walker rolling DME Agency: [employer] Date DME Agency Contacted: 05/02/19 Time DME Agency Contacted:  [phone] Representative spoke with at DME Agency: [place]: PT [place]: [facility] ([facility] [employer]) Date [place]: 05/02/19 Time HH Agency Contacted: 0930 Representative spoke with at [place]: [place] Arrangements/Services Living arrangements for the past 2 months: [place] with:: Adult Children, Spouse Patient language and need for interpreter reviewed:: Yes Do you feel safe going back to the place where you live?: Yes      Need for Family Participation in Patient Care: Yes (Comment) Care giver support system in place?: Yes (comment)   Criminal Activity/Legal Involvement Pertinent to Current Situation/Hospitalization: No - Comment as needed  Activities of Daily Living Home Assistive Devices/Equipment: Gilford Rile (specify type) ADL Screening (condition at time of admission) Patient's cognitive ability adequate to safely complete daily activities?: Yes Is the patient deaf or have difficulty hearing?: No Does the patient have difficulty seeing, even when wearing glasses/contacts?: No Does the patient have difficulty concentrating, remembering, or making decisions?: No Patient able to express need for assistance with ADLs?: Yes Does the patient have difficulty dressing or bathing?: No Independently performs ADLs?: Yes (appropriate for developmental age) Does the patient have difficulty walking or climbing stairs?: Yes Weakness of Legs: Right Weakness of Arms/Hands: None  Permission Sought/Granted Permission sought to share information with : Other (comment)(Home Health agency and DME agency) Permission granted to share information with : Yes, Verbal Permission Granted              Emotional Assessment Appearance:: Appears stated age Attitude/Demeanor/Rapport:  Engaged Affect (typically observed): Pleasant, Calm Orientation: : Oriented to Self, Oriented to Place, Oriented to  Time, Oriented to Situation Alcohol /  Substance Use: Not Applicable Psych Involvement: No (comment)  Admission diagnosis:  PRIMARY OSTEOARTHRITITS OF RIGHT KNEE Patient Active Problem List   Diagnosis Date Noted  . Total knee replacement status 05/01/2019  . Nonrheumatic mitral valve regurgitation 04/09/2019  . History of cardiac arrhythmia 09/12/2018  . Obesity (BMI 30.0-34.9) 07/05/2016  . Benign essential hypertension 11/01/2014   PCP:  Dion Body, MD Pharmacy:   [facility] [place], [place] [address] [place] [address] Phone: [phone] Fax: [phone]     Social Determinants of Health ([place]) Interventions    Readmission Risk Interventions No flowsheet data found.

## 2019-05-02 NOTE — Progress Notes (Signed)
Physical Therapy Treatment Patient Details Name: Danielle Robinson MRN: [id number] DOB: 05/13/1951 Today's Date: 05/02/2019    History of Present Illness 68 y/o female s/p R TKA 05/01/19.    PT Comments    Pt reports pain is moderate on arrival, we decided to do a few brief warm up exercises in recliner before ambulation with intent to do further exercises once we returned to room/bed.  Based on relative ease with ambulating ~100 ft this AM the plan was to try circumambulating the nurses' station this afternoon; however at 40-45 ft pt started feeling very poorly and needed to sit.  BP was taken as was very low, rapid response called and assisted pt back to room/bed.  (She was able to assist transferring back to bed).  Left pt in stable condition (with family and staff.) May try to do further supine exercises later this afternoon per medical status, will likely hold further gait training until tomorrow.  Follow Up Recommendations  Home health PT     Equipment Recommendations  Rolling walker with 5" wheels;3in1 (PT)    Recommendations for Other Services      Precautions / Restrictions Precautions Precautions: Fall Restrictions Weight Bearing Restrictions: Yes RLE Weight Bearing: Weight bearing as tolerated    Mobility  Bed Mobility               General bed mobility comments: deferred, up in recliner  Transfers Overall transfer level: Modified independent Equipment used: Rolling walker (2 wheeled)             General transfer comment: Pt able to stand w/o physical assist near beginning of session, some assist after syncopal episode  Ambulation/Gait Ambulation/Gait assistance: Min guard;Total assist Gait Distance (Feet): 45 Feet Assistive device: Rolling walker (2 wheeled)       General Gait Details: Pt initially started out with good ambulation, some stiffness with knee mechanics that she improved on with cuing.  At ~40 ft pt felt a little nauseous and then quickly  needed to sit.  Able to get chair under her, quickly took BP and numbers were very low (40s/30s) with pt feeling clamy and heavy.  Rapid response called and assisted pt back to room/bed with help from staff.   Stairs             Wheelchair Mobility    Modified Rankin (Stroke Patients Only)       Balance Overall balance assessment: Modified Independent                                          Cognition Arousal/Alertness: Awake/alert Behavior During Therapy: WFL for tasks assessed/performed Overall Cognitive Status: Within Functional Limits for tasks assessed                                        Exercises Total Joint Exercises Hip ABduction/ADduction: Strengthening;10 reps Long Arc Quad: AROM;Strengthening;10 reps Other Exercises Other Exercises: Pt/son instructed in compression stocking mgt, polar care mgt, falls prevention, home/routines modifications, AE/DME; handout provided    General Comments        Pertinent Vitals/Pain Pain Assessment: 0-10 Pain Score: 4 (pre ambulation)    Home Living Family/patient expects to be discharged to:: Private residence Living Arrangements: Spouse/significant other;Children Available Help at Discharge: Family;Available 24 hours/day Type of  Home: House Home Access: Stairs to enter Entrance Stairs-Rails: Left;Right(too wide to use both) Home Layout: Two level;Able to live on main level with bedroom/bathroom Home Equipment: None(may have a walker, if so not sure if it has wheels)      Prior Function Level of Independence: Independent          PT Goals (current goals can now be found in the care plan section) Acute Rehab PT Goals Patient Stated Goal: go home Progress towards PT goals: Not progressing toward goals - comment(syncopal episode during ambulation)    Frequency    BID      PT Plan      Co-evaluation              AM-PAC PT "6 Clicks" Mobility   Outcome  Measure  Help needed turning from your back to your side while in a flat bed without using bedrails?: None Help needed moving from lying on your back to sitting on the side of a flat bed without using bedrails?: None Help needed moving to and from a bed to a chair (including a wheelchair)?: None Help needed standing up from a chair using your arms (e.g., wheelchair or bedside chair)?: None Help needed to walk in hospital room?: A Little Help needed climbing 3-5 steps with a railing? : A Little 6 Click Score: 22    End of Session Equipment Utilized During Treatment: Gait belt Activity Tolerance: (syncopal type episode during ambulation) Patient left: with call bell/phone within reach;with chair alarm set Nurse Communication: Mobility status PT Visit Diagnosis: Muscle weakness (generalized) (M62.81);Difficulty in walking, not elsewhere classified (R26.2);Pain Pain - Right/Left: Right Pain - part of body: Knee     Time: 7782-4235 PT Time Calculation (min) (ACUTE ONLY): 17 min  Charges:  $Therapeutic Exercise: 8-22 mins                     Malachi Pro, DPT 05/02/2019, 2:47 PM

## 2019-05-02 NOTE — Progress Notes (Signed)
Ch received a pg regard pt that showed elevated BP and became dizzy. Upon ch arrival, the care team had the pt's vitals stabilized and the pt was alert.Ch checked in with staff that witnessed the event and pt's son who was at bedside. Unit ch would f/u with pt later.    05/02/19 1400  Clinical Encounter Type  Visited With Patient and family together;Health care provider  Visit Type Code;Critical Care  Referral From Nurse  Consult/Referral To Chaplain  Spiritual Encounters  Spiritual Needs Emotional  Stress Factors  Patient Stress Factors Exhausted;Health changes;Loss of control;Major life changes  Family Stress Factors Major life changes

## 2019-05-02 NOTE — Progress Notes (Signed)
  Subjective: 1 Day Post-Op Procedure(s) (LRB): COMPUTER ASSISTED TOTAL KNEE ARTHROPLASTY (Right) Patient reports pain as  Well-controlled..   Patient is well, and has had no acute complaints or problems Plan is to go Home after hospital stay. Negative for chest pain and shortness of breath Fever: no Gastrointestinal: positive for nausea after returning to the room from surgery.  Patient denies any vomiting and has no current nausea. Patient has not had a bowel movement.  Objective: Vital signs in last 24 hours: Temp:  [96.9 F (36.1 C)-98 F (36.7 C)] 97.6 F (36.4 C) (10/22 0731) Pulse Rate:  [53-80] 56 (10/22 0731) Resp:  [14-19] 16 (10/22 0731) BP: (118-153)/(62-89) 122/62 (10/22 0731) SpO2:  [97 %-100 %] 98 % (10/22 0731) Weight:  [88.5 kg] 88.5 kg (10/21 1848)  Intake/Output from previous day:  Intake/Output Summary (Last 24 hours) at 05/02/2019 0756 Last data filed at 05/02/2019 0507 Gross per 24 hour  Intake 4008.08 ml  Output 1965 ml  Net 2043.08 ml    Intake/Output this shift: No intake/output data recorded.  Labs: No results for input(s): HGB in the last 72 hours. No results for input(s): WBC, RBC, HCT, PLT in the last 72 hours. No results for input(s): NA, K, CL, CO2, BUN, CREATININE, GLUCOSE, CALCIUM in the last 72 hours. No results for input(s): LABPT, INR in the last 72 hours.   EXAM General - Patient is Alert, Appropriate and Oriented Extremity - Neurovascular intact Dorsiflexion/Plantar flexion intact Compartment soft Dressing/Incision -Polar care and postoperative dressing remain in place.  Drain remains in place. Motor Function - intact, moving foot and toes well on exam.  Able to perform straight leg raise. Cardiovascular- Regular rate and rhythm, no murmurs/rubs/gallops Respiratory- Lungs clear to auscultation bilaterally Gastrointestinal- active bowel sounds   Assessment/Plan: 1 Day Post-Op Procedure(s) (LRB): COMPUTER ASSISTED TOTAL KNEE  ARTHROPLASTY (Right) Active Problems:   Total knee replacement status  Estimated body mass index is 38.1 kg/m as calculated from the following:   Height as of this encounter: 5' (1.524 m).   Weight as of this encounter: 88.5 kg. Advance diet Up with therapy Plan for discharge tomorrow    DVT Prophylaxis - Lovenox and foot pumps Weight-Bearing as tolerated to right leg  Cassell Smiles, PA-C [facility] Orthopaedic Surgery 05/02/2019, 7:56 AM

## 2019-05-02 NOTE — Progress Notes (Signed)
Physical Therapy Treatment Patient Details Name: Danielle Robinson MRN: [id number] DOB: 11/30/1950 Today's Date: 05/02/2019    History of Present Illness 68 y/o female s/p R TKA 05/01/19.    PT Comments    Follow up session post BP drop during ambulation earlier this afternoon.  Pt's vitals have stabilized, however we deferred further mobility this session.  Pt did well with supine exercises and showed good understanding with written HEP.  Son present and helpful t/o session.  Pt continues to have POD1 appropriate strength and ROM.  Will continue PT per TKA protocol moving forward.   Follow Up Recommendations  Home health PT     Equipment Recommendations  Rolling walker with 5" wheels;3in1 (PT)    Recommendations for Other Services       Precautions / Restrictions Precautions Precautions: Fall Restrictions RLE Weight Bearing: Weight bearing as tolerated    Mobility  Bed Mobility               General bed mobility comments: deferred mobility this session  Transfers Overall transfer level: Modified independent Equipment used: Rolling walker (2 wheeled)             General transfer comment: Pt able to stand w/o physical assist near beginning of session, some assist after syncopal episode  Ambulation/Gait Ambulation/Gait assistance: Min guard;Total assist Gait Distance (Feet): 45 Feet Assistive device: Rolling walker (2 wheeled)       General Gait Details: Pt initially started out with good ambulation, some stiffness with knee mechanics that she improved on with cuing.  At ~40 ft pt felt a little nauseous and then quickly needed to sit.  Able to get chair under her, quickly took BP and numbers were very low (40s/30s) with pt feeling clamy and heavy.  Rapid response called and assisted pt back to room/bed with help from staff.   Stairs             Wheelchair Mobility    Modified Rankin (Stroke Patients Only)       Balance Overall balance assessment:  Modified Independent                                          Cognition Arousal/Alertness: Awake/alert Behavior During Therapy: WFL for tasks assessed/performed Overall Cognitive Status: Within Functional Limits for tasks assessed                                        Exercises Total Joint Exercises Quad Sets: Strengthening;10 reps Short Arc Quad: Strengthening;10 reps Heel Slides: AAROM;Strengthening;10 reps(resisted leg extensions) Hip ABduction/ADduction: Strengthening;10 reps Straight Leg Raises: AROM;10 reps Long Arc Quad: AROM;Strengthening;10 reps Knee Flexion: PROM;5 reps Goniometric ROM: 0-77    General Comments        Pertinent Vitals/Pain Pain Assessment: 0-10 Pain Score: 2 (increases with ROM)    Home Living                      Prior Function            PT Goals (current goals can now be found in the care plan section) Progress towards PT goals: Progressing toward goals    Frequency    BID      PT Plan Current plan remains appropriate    Co-evaluation  AM-PAC PT "6 Clicks" Mobility   Outcome Measure  Help needed turning from your back to your side while in a flat bed without using bedrails?: None Help needed moving from lying on your back to sitting on the side of a flat bed without using bedrails?: None Help needed moving to and from a bed to a chair (including a wheelchair)?: None Help needed standing up from a chair using your arms (e.g., wheelchair or bedside chair)?: None Help needed to walk in hospital room?: A Little Help needed climbing 3-5 steps with a railing? : A Little 6 Click Score: 22    End of Session Equipment Utilized During Treatment: Gait belt Activity Tolerance: Patient tolerated treatment well Patient left: with call bell/phone within reach;with chair alarm set Nurse Communication: Mobility status PT Visit Diagnosis: Muscle weakness (generalized)  (M62.81);Difficulty in walking, not elsewhere classified (R26.2);Pain Pain - Right/Left: Right Pain - part of body: Knee     Time: 1714-1730 PT Time Calculation (min) (ACUTE ONLY): 16 min  Charges:  $Therapeutic Exercise: 8-22 mins                     Malachi Pro, DPT 05/02/2019, 5:44 PM

## 2019-05-02 NOTE — TOC Progression Note (Signed)
Transition of Care [facility]) - Progression Note    Patient Details  Name: Danielle Robinson MRN: [id number] Date of Birth: [birth year]  [facility]) CM/SW Contact  Melrose Kearse, Lenice Llamas Phone Number: [phone]  05/02/2019, 9:18 AM  Clinical Narrative: Lovenox price requested.          Expected Discharge Plan and Services                                                 Social Determinants of Health (SDOH) Interventions    Readmission Risk Interventions No flowsheet data found.

## 2019-05-02 NOTE — TOC Benefit Eligibility Note (Signed)
Transition of Care [facility]) Benefit Eligibility Note    Patient Details  Name: Danielle Robinson MRN: [id number] Date of Birth: 05/23/1951   Medication/Dose: Lovenox 40 mg daily x 14 days              Co-Pay: $3.00        Additional Notes: [employer] shows Medicaid is active    Antonietta Breach Number: 05/02/2019, 2:08 PM

## 2019-05-02 NOTE — Significant Event (Signed)
Rapid Response Event Not  Initial Focused Assessment: Page received at 1349 to respond to room 140, arrived to room at 1351. Bedside nurse reported that pt was POD 1 R TKR. Per physical therapist Guerry Minors, pt was working with him and had walked approximately 45 feet when she reported feeling dizzy and nauseated. Upon getting her back to bed and checking her blood pressure, it was 44/33. PT and RN placed pt in trendelenburg and repeat vital signs BP 124/62, HR 59, 99% on RA. Pt reported that she felt better and dizziness and nausea had resolved   Interventions: Pt placed on cardiac monitor, rhythm NSR to SB. Last set of vitals BP 114/64 with MAP of 80, HR 60, pulse on 99% on RA.    Plan of Care (if not transferred): Pt remained on 1A, Dr. Marry Guan notified.   Event Summary:   at      at          [facility]

## 2019-05-03 MED ORDER — CELECOXIB 200 MG PO CAPS: 200.0000 mg | ORAL_CAPSULE | Freq: Two times a day (BID) | ORAL | 0 refills | Status: AC

## 2019-05-03 MED ORDER — ENOXAPARIN SODIUM 40 MG/0.4ML ~~LOC~~ SOLN: 40.0000 mg | SUBCUTANEOUS | 0 refills | Status: AC

## 2019-05-03 MED ORDER — OXYCODONE HCL 5 MG PO TABS: 5.0000 mg | ORAL_TABLET | ORAL | 0 refills | Status: DC | PRN

## 2019-05-03 NOTE — Progress Notes (Signed)
Discharge instructions and prescriptions given to patient and husband with verbalization of understanding. IV discontinued by previous nurse Koren Shiver. Ted hose applied prior to patient discharging.

## 2019-05-03 NOTE — TOC Transition Note (Signed)
Transition of Care [facility]) - CM/SW Discharge Note   Patient Details  Name: Laelle Bridgett MRN: [id number] Date of Birth: June 08, 1951  Transition of Care [facility]) CM/SW Contact:  Shabana Armentrout, Lenice Llamas Phone Number: [phone]  05/03/2019, 11:25 AM   Clinical Narrative: Clinical Social Worker (CSW) notified Helene Kelp [facility] home [facility] representative that patient will D/C home today. Per [place] DME agency representative rolling walker and bedside commode have been delivered to patient's room. Patient and her son Octavia Bruckner are aware of Lovenox price $3. Please reconsult if future social work needs arise. CSW signing off.      Final next level of care: [place] Barriers to Discharge: Barriers Resolved   Patient Goals and CMS Choice Patient states their goals for this hospitalization and ongoing recovery are:: To go home. CMS Medicare.gov Compare Post Acute Care list provided to:: Patient Choice offered to / list presented to : Patient  Discharge Placement                       Discharge Plan and Services In-house Referral: Clinical Social Work   Post Acute Care Choice: Home Health          DME Arranged: Bedside commode, Walker rolling DME Agency: [employer] Date DME Agency Contacted: 05/02/19 Time DME Agency Contacted: [phone] Representative spoke with at DME Agency: [place]: PT [place]: [facility] ([facility] [employer]) Date [place]: 05/03/19 Time [place]: 1124 Representative spoke with at [place]: [place] ([place]) Interventions     Readmission Risk Interventions No flowsheet data found.

## 2019-05-03 NOTE — Plan of Care (Signed)

## 2019-05-03 NOTE — Progress Notes (Signed)
Physical Therapy Treatment Patient Details Name: Danielle Robinson MRN: [id number] DOB: Oct 15, 1950 Today's Date: 05/03/2019    History of Present Illness 68 y/o female s/p R TKA 05/01/19.    PT Comments    Pt did well with PT session this AM, no safety concerns or episodes with session.  She was able to circumambulate the nurses station with consistent cadence and good confidence; vitals stable and appropriate. She was able to negotiate up/down steps with single R rail w/o physical assist.  Pt with less confidence with against gravity exercises today and needed assist with SLRs this date.  Shedid have more pain with exercises today; she feels pain meds may have been the culprit with yesterday's episode so trying not to take too much.  She was eager to see what she could do and overall is progressing as expected POD2.  Follow Up Recommendations  Home health PT     Equipment Recommendations  Rolling walker with 5" wheels;3in1 (PT)    Recommendations for Other Services       Precautions / Restrictions Precautions Precautions: Fall Restrictions RLE Weight Bearing: Weight bearing as tolerated    Mobility  Bed Mobility               General bed mobility comments: In recliner on arrival  Transfers Overall transfer level: Modified independent Equipment used: Rolling walker (2 wheeled)             General transfer comment: Pt able to rise to standing without hesitation, did need cues in returning to sitting to insure appropriate positioning of walker and R LE.    Ambulation/Gait Ambulation/Gait assistance: Supervision Gait Distance (Feet): 250 Feet Assistive device: Rolling walker (2 wheeled)       General Gait Details: After brief period of slower warm up ambulation she was able to attain consistent cadence and was not overly reliant on the walker.  Her vitals remained stable t/o the effort and there were no safety concerns t/o the ambulation bout.    Stairs Stairs:  Yes Stairs assistance: Min guard Stair Management: One rail Right;Sideways Number of Stairs: 4 General stair comments: Pt was able to negotiate up/down steps with minimal cuing and no physical assist.  Pt did well, son present during effort and feels good about being able to assist this with home.   Wheelchair Mobility    Modified Rankin (Stroke Patients Only)       Balance Overall balance assessment: Modified Independent                                          Cognition Arousal/Alertness: Awake/alert Behavior During Therapy: WFL for tasks assessed/performed Overall Cognitive Status: Within Functional Limits for tasks assessed                                        Exercises Total Joint Exercises Ankle Circles/Pumps: AROM;10 reps Quad Sets: Strengthening;15 reps Short Arc Quad: AAROM;10 reps;AROM(AAROM first few reps, pt struggled to actively extend) Heel Slides: 10 reps;AROM Hip ABduction/ADduction: Strengthening;10 reps Straight Leg Raises: AAROM;10 reps(in recliner, pt unable to initiate against gravity AROM ) Knee Flexion: PROM;5 reps Goniometric ROM: 0-84    General Comments        Pertinent Vitals/Pain Pain Assessment: 0-10 Pain Score: 4  Pain Location: pain  did not have a lot of assist initially, however she did have considerable pain with ROM tasks    Home Living                      Prior Function            PT Goals (current goals can now be found in the care plan section) Progress towards PT goals: Progressing toward goals    Frequency    BID      PT Plan Current plan remains appropriate    Co-evaluation              AM-PAC PT "6 Clicks" Mobility   Outcome Measure  Help needed turning from your back to your side while in a flat bed without using bedrails?: None Help needed moving from lying on your back to sitting on the side of a flat bed without using bedrails?: None Help needed  moving to and from a bed to a chair (including a wheelchair)?: None Help needed standing up from a chair using your arms (e.g., wheelchair or bedside chair)?: None Help needed to walk in hospital room?: None Help needed climbing 3-5 steps with a railing? : A Little 6 Click Score: 23    End of Session Equipment Utilized During Treatment: Gait belt Activity Tolerance: Patient tolerated treatment well Patient left: with call bell/phone within reach;with chair alarm set Nurse Communication: Mobility status PT Visit Diagnosis: Muscle weakness (generalized) (M62.81);Difficulty in walking, not elsewhere classified (R26.2);Pain Pain - Right/Left: Right Pain - part of body: Knee     Time: 6962-9528 PT Time Calculation (min) (ACUTE ONLY): 43 min  Charges:  $Gait Training: 8-22 mins $Therapeutic Exercise: 8-22 mins $Therapeutic Activity: 8-22 mins                      Malachi Pro, DPT 05/03/2019, 11:02 AM

## 2019-05-03 NOTE — Progress Notes (Addendum)
  Subjective: 2 Days Post-Op Procedure(s) (LRB): COMPUTER ASSISTED TOTAL KNEE ARTHROPLASTY (Right) Patient reports pain as  well-controlled..   Patient is well, and has had no acute complaints or problems Plan is to go Home after hospital stay. Negative for chest pain and shortness of breath Fever: no Gastrointestinal: negative for nausea and vomiting.  Patient has not had a bowel movement.  Objective: Vital signs in last 24 hours: Temp:  [97.6 F (36.4 C)-98.1 F (36.7 C)] 98.1 F (36.7 C) (10/22 2357) Pulse Rate:  [49-64] 63 (10/22 2357) Resp:  [16-18] 16 (10/22 2357) BP: (44-124)/(33-66) 113/58 (10/22 2357) SpO2:  [98 %-100 %] 98 % (10/22 2357)  Intake/Output from previous day:  Intake/Output Summary (Last 24 hours) at 05/03/2019 0748 Last data filed at 05/03/2019 0400 Gross per 24 hour  Intake 1828.33 ml  Output 250 ml  Net 1578.33 ml    Intake/Output this shift: No intake/output data recorded.  Labs: No results for input(s): HGB in the last 72 hours. No results for input(s): WBC, RBC, HCT, PLT in the last 72 hours. No results for input(s): NA, K, CL, CO2, BUN, CREATININE, GLUCOSE, CALCIUM in the last 72 hours. No results for input(s): LABPT, INR in the last 72 hours.   EXAM General - Patient is Alert, Appropriate and Oriented Extremity - Neurovascular intact Dorsiflexion/Plantar flexion intact Dressing/Incision -postoperative dressing remains in place. Polar Care remains in place.  Following removal of postoperative dressing, incision shows no drainage. Motor Function - intact, moving foot and toes well on exam.  Unable to perform straight leg raise. Cardiovascular- Regular rate and rhythm, no murmurs/rubs/gallops Respiratory- Lungs clear to auscultation bilaterally Gastrointestinal- active bowel sounds   Assessment/Plan: 2 Days Post-Op Procedure(s) (LRB): COMPUTER ASSISTED TOTAL KNEE ARTHROPLASTY (Right) Active Problems:   Total knee replacement  status  Estimated body mass index is 38.1 kg/m as calculated from the following:   Height as of this encounter: 5' (1.524 m).   Weight as of this encounter: 88.5 kg. Advance diet Up with therapy Discharge home with home health  Postoperative dressing removed. Hemovac drain removed.  Honeycomb dressing replaced.  DVT Prophylaxis - Lovenox and foot pumps Weight-Bearing as tolerated to right leg  Cassell Smiles, PA-C [facility] Orthopaedic Surgery 05/03/2019, 7:48 AM

## 2019-05-03 NOTE — Discharge Summary (Signed)
Physician Discharge Summary  Patient ID: Danielle Robinson MRN: [id number] DOB/AGE: [birth year] 68 y.o.  Admit date: 05/01/2019 Discharge date: 05/03/2019  Admission Diagnoses:  PRIMARY OSTEOARTHRITITS OF RIGHT KNEE  Surgeries: Right total knee arthroplasty using computer-assisted navigation  SURGEON:  Marciano Sequin. M.D.  ASSISTANT: Cassell Smiles, PA-C (present and scrubbed throughout the case, critical for assistance with exposure, retraction, instrumentation, and closure)  ANESTHESIA: spinal  ESTIMATED BLOOD LOSS: 50 mL  FLUIDS REPLACED: 1100 mL of crystalloid  TOURNIQUET TIME: 93 minutes  DRAINS: 2 medium Hemovac drains  SOFT TISSUE RELEASES: Anterior cruciate ligament, posterior cruciate ligament, deep medial collateral ligament, patellofemoral ligament  IMPLANTS UTILIZED: DePuy Attune size 6N posterior stabilized femoral component (cemented), size 6 rotating platform tibial component (cemented), 35 mm medialized dome patella (cemented), and a 5 mm stabilized rotating platform polyethylene insert.  Discharge Diagnoses: Patient Active Problem List   Diagnosis Date Noted  . Total knee replacement status 05/01/2019  . Nonrheumatic mitral valve regurgitation 04/09/2019  . History of cardiac arrhythmia 09/12/2018  . Obesity (BMI 30.0-34.9) 07/05/2016  . Benign essential hypertension 11/01/2014    Past Medical History:  Diagnosis Date  . Anxiety   . Arthritis   . Atrial fibrillation ([place])   . CHF (congestive heart failure) ([place])   . Complication of anesthesia    difficulty waking up after foot surgery in 2016 at [facility]  . Depression   . Dysrhythmia   . GERD (gastroesophageal reflux disease)   . H/O active rheumatic fever   . Hypertension   . Palpitations   . Panic attacks      Transfusion: n/a   Consultants (if any):   Discharged Condition: Improved  Hospital Course: Danielle Robinson is an 68 y.o. female who was admitted 05/01/2019 with a diagnosis of  right knee osteoarthritis and went to the operating room on 05/01/2019 and underwent right total knee arthroplasty. The patient received perioperative antibiotics for prophylaxis (see below). The patient tolerated the procedure well and was transported to PACU in stable condition. After meeting PACU criteria, the patient was subsequently transferred to the Orthopaedics/Rehabilitation unit.   The patient received DVT prophylaxis in the form of early mobilization, Lovenox, Foot Pumps and TED hose. A sacral pad had been placed and heels were elevated off of the bed with rolled towels in order to protect skin integrity. Foley catheter was discontinued on postoperative day #1. Wound drains were discontinued on postoperative day #2. The surgical incision was healing well without signs of infection.  Physical therapy was initiated postoperatively for transfers, gait training, and strengthening. Occupational therapy was initiated for activities of daily living and evaluation for assisted devices. Rehabilitation goals were reviewed in detail with the patient. The patient made steady progress with physical therapy and physical therapy recommended discharge to Home. Patient did receive a 500 mL bolus following an episode of dizziness during PT on POD 1.  The patient achieved her preliminary goals of this hospitalization and was felt to be medically and orthopaedically appropriate for discharge.  She was given perioperative antibiotics:  Anti-infectives (From admission, onward)   Start     Dose/Rate Route Frequency Ordered Stop   05/01/19 1900  ceFAZolin (ANCEF) IVPB 2g/100 mL premix     2 g 200 mL/hr over 30 Minutes Intravenous Every 6 hours 05/01/19 1812 05/02/19 1707   05/01/19 1123  ceFAZolin (ANCEF) 2-4 GM/100ML-% IVPB    Note to Pharmacy: Norton Blizzard  : cabinet override      05/01/19 1123 05/01/19  1813   05/01/19 0600  ceFAZolin (ANCEF) IVPB 2g/100 mL premix  Status:  Discontinued     2 g 200  mL/hr over 30 Minutes Intravenous On call to O.R. 05/01/19 0024 05/01/19 1034    .  Recent vital signs:  Vitals:   05/02/19 1648 05/02/19 2357  BP: 114/66 (!) 113/58  Pulse: 64 63  Resp:  16  Temp:  98.1 F (36.7 C)  SpO2: 100% 98%    Recent laboratory studies:  No results for input(s): WBC, HGB, HCT, PLT, K, CL, CO2, BUN, CREATININE, GLUCOSE, CALCIUM, LABPT, INR in the last 72 hours.  Diagnostic Studies: Dg Knee Right Port  Result Date: 05/01/2019 CLINICAL DATA:  Post RIGHT total knee arthroplasty EXAM: PORTABLE RIGHT KNEE - 1-2 VIEW COMPARISON:  Portable exam 1656 hours without priors for comparison FINDINGS: Bones demineralized. Anterior surgical drains and skin clips present. Components of a RIGHT knee prosthesis are identified. No acute fracture, dislocation, or bone destruction. IMPRESSION: RIGHT knee prosthesis without acute complication. Electronically Signed   By: Ulyses Southward M.D.   On: 05/01/2019 17:15    Discharge Medications:   Allergies as of 05/03/2019   No Known Allergies     Medication List    STOP taking these medications   aspirin EC 81 MG tablet     TAKE these medications   celecoxib 200 MG capsule Commonly known as: CELEBREX Take 1 capsule (200 mg total) by mouth 2 (two) times daily.   enoxaparin 40 MG/0.4ML injection Commonly known as: LOVENOX Inject 0.4 mLs (40 mg total) into the skin daily for 14 days.   furosemide 40 MG tablet Commonly known as: LASIX Take 40 mg by mouth daily as needed for fluid or edema.   Melatonin 5 MG Caps Take 1 capsule by mouth at bedtime.   oxyCODONE 5 MG immediate release tablet Commonly known as: Oxy IR/ROXICODONE Take 1 tablet (5 mg total) by mouth every 4 (four) hours as needed for moderate pain (pain score 4-6).   Pazeo 0.7 % Soln Generic drug: Olopatadine HCl Apply 1 drop to eye daily.   VERAPAMIL HCL ER (CO) PO Take 40 mg by mouth daily.            Durable Medical Equipment  (From admission,  onward)         Start     Ordered   05/01/19 1813  DME Walker rolling  Once    Question:  Patient needs a walker to treat with the following condition  Answer:  Total knee replacement status   05/01/19 1812   05/01/19 1813  DME Bedside commode  Once    Question:  Patient needs a bedside commode to treat with the following condition  Answer:  Total knee replacement status   05/01/19 1812          Disposition: Discharge disposition: 01-Home or Self Care         Follow-up Information    Madelyn Flavors, PA-C On 05/16/2019.   Specialty: Orthopedic Surgery Why: at 1:15pm Contact information: [address] [facility] [place] [address] [phone]        Donato Heinz, MD On 06/13/2019.   Specialty: Orthopedic Surgery Why: at 10:00am Contact information: [address] [facility] [place] [address] [phone]            Lasandra Beech, PA-C 05/03/2019, 7:59 AM

## 2019-08-24 ENCOUNTER — Ambulatory Visit: Payer: Medicare Other

## 2019-09-16 ENCOUNTER — Ambulatory Visit: Payer: Medicare Other | Attending: Internal Medicine

## 2019-09-16 DIAGNOSIS — Z23 Encounter for immunization: Secondary | ICD-10-CM | POA: Insufficient documentation

## 2019-09-16 NOTE — Progress Notes (Signed)
   Covid-19 Vaccination Clinic  Name:  Arleth Mccullar    MRN: [id number] DOB: 06-01-1951  09/16/2019  Ms. Capurro was observed post Covid-19 immunization for 15 minutes without incident. She was provided with Vaccine Information Sheet and instruction to access the V-Safe system.   Ms. Esperanza was instructed to call 911 with any severe reactions post vaccine: Marland Kitchen Difficulty breathing  . Swelling of face and throat  . A fast heartbeat  . A bad rash all over body  . Dizziness and weakness   Immunizations Administered    Name Date Dose VIS Date Route   Pfizer COVID-19 Vaccine 09/16/2019  8:55 AM 0.3 mL 06/21/2019 Intramuscular   Manufacturer: [employer], [employer]   Lot: [id number]   NDC: [address]-7

## 2019-10-09 ENCOUNTER — Ambulatory Visit: Payer: Medicare Other | Attending: Internal Medicine

## 2019-10-09 DIAGNOSIS — Z23 Encounter for immunization: Secondary | ICD-10-CM

## 2019-10-09 NOTE — Progress Notes (Signed)
   Covid-19 Vaccination Clinic  Name:  Danielle Robinson    MRN: [id number] DOB: 1951-03-05  10/09/2019  Danielle Robinson was observed post Covid-19 immunization for 15 minutes without incident. She was provided with Vaccine Information Sheet and instruction to access the V-Safe system.   Danielle Robinson was instructed to call 911 with any severe reactions post vaccine: Marland Kitchen Difficulty breathing  . Swelling of face and throat  . A fast heartbeat  . A bad rash all over body  . Dizziness and weakness   Immunizations Administered    Name Date Dose VIS Date Route   Pfizer COVID-19 Vaccine 10/09/2019 12:10 PM 0.3 mL 06/21/2019 Intramuscular   Manufacturer: [employer], [employer]   Lot: [id number]   NDC: [address]-4

## 2021-05-12 ENCOUNTER — Emergency Department
Admission: EM | Admit: 2021-05-12 | Discharge: 2021-05-12 | Discharge status: Against Medical Advice | Payer: Medicare Other

## 2021-07-16 ENCOUNTER — Other Ambulatory Visit: Payer: Self-pay | Admitting: Orthopedic Surgery

## 2021-07-16 DIAGNOSIS — G8929 Other chronic pain: Secondary | ICD-10-CM

## 2021-07-27 ENCOUNTER — Ambulatory Visit
Admission: RE | Admit: 2021-07-27 | Discharge: 2021-07-27 | Disposition: A | Discharge status: Home / Self Care | Payer: Medicare Other | Source: Ambulatory Visit | Attending: Orthopedic Surgery | Admitting: Orthopedic Surgery

## 2021-07-27 DIAGNOSIS — M25511 Pain in right shoulder: Secondary | ICD-10-CM | POA: Insufficient documentation

## 2021-07-27 DIAGNOSIS — G8929 Other chronic pain: Secondary | ICD-10-CM | POA: Insufficient documentation

## 2022-09-13 ENCOUNTER — Other Ambulatory Visit: Payer: Self-pay | Admitting: Internal Medicine

## 2022-09-13 DIAGNOSIS — R9439 Abnormal result of other cardiovascular function study: Secondary | ICD-10-CM

## 2022-09-13 DIAGNOSIS — R0789 Other chest pain: Secondary | ICD-10-CM

## 2022-10-06 ENCOUNTER — Telehealth (HOSPITAL_COMMUNITY): Payer: Self-pay | Admitting: *Deleted

## 2022-10-06 NOTE — Telephone Encounter (Signed)
Reaching out to patient to offer assistance regarding upcoming cardiac imaging study; pt verbalizes understanding of appt date/time, parking situation and where to check in, pre-test NPO status, and verified current allergies; name and call back number provided for further questions should they arise  Gordy Clement RN Navigator Cardiac Imaging [facility] [phone] office [phone] cell  Patient to take 25mg  metoprolol tartrate two hours prior to her cardiac CT scan.

## 2022-10-10 ENCOUNTER — Ambulatory Visit
Admission: RE | Admit: 2022-10-10 | Discharge: 2022-10-10 | Disposition: A | Discharge status: Home / Self Care | Payer: Medicare Other | Source: Ambulatory Visit | Attending: Internal Medicine | Admitting: Internal Medicine

## 2022-10-10 DIAGNOSIS — I350 Nonrheumatic aortic (valve) stenosis: Secondary | ICD-10-CM | POA: Diagnosis not present

## 2022-10-10 DIAGNOSIS — R9439 Abnormal result of other cardiovascular function study: Secondary | ICD-10-CM | POA: Insufficient documentation

## 2022-10-10 DIAGNOSIS — R0789 Other chest pain: Secondary | ICD-10-CM | POA: Diagnosis present

## 2022-10-10 MED ORDER — NITROGLYCERIN 0.4 MG SL SUBL
0.8000 mg | SUBLINGUAL_TABLET | Freq: Once | SUBLINGUAL | Status: AC
  Administered 2022-10-10: 0.8 mg via SUBLINGUAL
  Filled 2022-10-10: qty 25

## 2022-10-10 MED ORDER — IOHEXOL 350 MG/ML SOLN
100.0000 mL | Freq: Once | INTRAVENOUS | Status: AC | PRN
  Administered 2022-10-10: 100 mL via INTRAVENOUS

## 2022-10-10 NOTE — Progress Notes (Signed)
Patient tolerated procedure well. Ambulate w/o difficulty. Denies any lightheadedness or being dizzy. Pt denies any pain at this time. Sitting in chair. Pt is encouraged to drink additional water throughout the day and reason explained to patient. Patient verbalized understanding and all questions answered. ABC intact. No further needs at this time. Discharge from procedure area w/o issues. 

## 2022-11-13 DIAGNOSIS — I890 Lymphedema, not elsewhere classified: Secondary | ICD-10-CM | POA: Insufficient documentation

## 2022-11-13 NOTE — Progress Notes (Deleted)
MRN : [id number]  Danielle Robinson is a 72 y.o. ([birth year]) female who presents with chief complaint of legs hurt and swell.  History of Present Illness:   Patient is seen for evaluation of leg swelling. The patient first noticed the swelling remotely but is now concerned because of a significant increase in the overall edema. The swelling isn't associated with significant pain.  There has been an increasing amount of  discoloration noted by the patient. The patient notes that in the morning the legs are improved but they steadily worsened throughout the course of the day. Elevation seems to make the swelling of the legs better, dependency makes them much worse.   There is no history of ulcerations associated with the swelling.   The patient denies any recent changes in their medications.  The patient has not been wearing graduated compression.  The patient has no had any past angiography, interventions or vascular surgery.  The patient denies a history of DVT or PE. There is no prior history of phlebitis. There is no history of primary lymphedema.  There is no history of radiation treatment to the groin or pelvis No history of malignancies. No history of trauma or groin or pelvic surgery. No history of foreign travel or parasitic infections area   No outpatient medications have been marked as taking for the 11/14/22 encounter (Appointment) with Gilda Crease, Latina Craver, MD.    Past Medical History:  Diagnosis Date   Anxiety    Arthritis    Atrial fibrillation (H[facility])    CHF (congestive heart failure) (H[facility])    Complication of anesthesia    difficulty waking up after foot surgery in 2016 at [facility]   Depression    Dysrhythmia    GERD (gastroesophageal reflux disease)    H/O active rheumatic fever    Hypertension    Palpitations    Panic attacks     Past Surgical History:  Procedure Laterality Date   ABDOMINAL HYSTERECTOMY     BILATERAL SALPINGOOPHORECTOMY     bilateral  varicise vein laser surgery     CESAREAN SECTION     x 3   COLONOSCOPY     COLONOSCOPY WITH PROPOFOL N/A 08/13/2018   Procedure: COLONOSCOPY WITH PROPOFOL;  Surgeon: Scot Jun, MD;  Location: [facility] END[facility]COPY;  Service: Endoscopy;  Laterality: N/A;   FOOT FRACTURE SURGERY     FOOT SURGERY     KNEE ARTHROPLASTY Right 05/01/2019   Procedure: COMPUTER ASSISTED TOTAL KNEE ARTHROPLASTY;  Surgeon: Donato Heinz, MD;  Location: [facility] ORS;  Service: Orthopedics;  Laterality: Right;   KNEE ARTHROSCOPY Right 09/28/2015   Procedure: ARTHROSCOPY KNEE, MEDIAL AND LATERAL MENISECTOMY, CHONDROPLASTY;  Surgeon: Donato Heinz, MD;  Location: [facility] ORS;  Service: Orthopedics;  Laterality: Right;    Social History Social History   Tobacco Use   Smoking status: Former   Smokeless tobacco: Never  Building services engineer Use: Never used  Substance Use Topics   Alcohol use: No    Alcohol/week: 0.0 standard drinks of alcohol   Drug use: No    Family History No family history on file.  No Known Allergies   REVIEW OF SYSTEMS (Negative unless checked)  Constitutional: [] Weight loss  [] Fever  [] Chills Cardiac: [] Chest pain   [] Chest pressure   [] Palpitations   [] Shortness of breath when laying flat   [] Shortness of breath with exertion. Vascular:  [] Pain in legs with walking   [  x]Pain in legs at rest  [] History of DVT   [] Phlebitis   [x] Swelling in legs   [] Varicose veins   [] Non-healing ulcers Pulmonary:   [] Uses home oxygen   [] Productive cough   [] Hemoptysis   [] Wheeze  [] COPD   [] Asthma Neurologic:  [] Dizziness   [] Seizures   [] History of stroke   [] History of TIA  [] Aphasia   [] Vissual changes   [] Weakness or numbness in arm   [] Weakness or numbness in leg Musculoskeletal:   [] Joint swelling   [] Joint pain   [] Low back pain Hematologic:  [] Easy bruising  [] Easy bleeding   [] Hypercoagulable state   [] Anemic Gastrointestinal:  [] Diarrhea   [] Vomiting  [] Gastroesophageal reflux/heartburn    [] Difficulty swallowing. Genitourinary:  [] Chronic kidney disease   [] Difficult urination  [] Frequent urination   [] Blood in urine Skin:  [] Rashes   [] Ulcers  Psychological:  [] History of anxiety   []  History of major depression.  Physical Examination  There were no vitals filed for this visit. There is no height or weight on file to calculate BMI. Gen: WD/WN, NAD Head: [place]/AT, No temporalis wasting.  Ear/Nose/Throat: Hearing grossly intact, nares w/o erythema or drainage, pinna without lesions Eyes: PER, EOMI, sclera nonicteric.  Neck: Supple, no gross masses.  No JVD.  Pulmonary:  Good air movement, no audible wheezing, no use of accessory muscles.  Cardiac: RRR, precordium not hyperdynamic. Vascular:  scattered varicosities present bilaterally.  Moderate venous stasis changes to the legs bilaterally.  2+ soft pitting edema. CEAP [id number]   Vessel Right Left  Radial Palpable Palpable  Gastrointestinal: soft, non-distended. No guarding/no peritoneal signs.  Musculoskeletal: M/S 5/5 throughout.  No deformity.  Neurologic: CN 2-12 intact. Pain and light touch intact in extremities.  Symmetrical.  Speech is fluent. Motor exam as listed above. Psychiatric: Judgment intact, Mood & affect appropriate for pt's clinical situation. Dermatologic: Venous rashes no ulcers noted.  No changes consistent with cellulitis. Lymph : No lichenification or skin changes of chronic lymphedema.  CBC Lab Results  Component Value Date   WBC 4.1 09/18/2015   HGB 15.0 09/28/2015   HCT 44.0 09/28/2015   MCV 88.6 09/18/2015   PLT 205 09/18/2015    BMET    Component Value Date/Time   NA 141 09/28/2015 1525   K 4.0 09/28/2015 1525   K 4.3 05/28/2014 1210   GLUCOSE 90 09/28/2015 1525   CrCl cannot be calculated (No successful lab value found.).  COAG Lab Results  Component Value Date   INR 0.9 04/23/2019    Radiology No results found.   Assessment/Plan There are no diagnoses linked to this  encounter.   Levora Dredge, MD  11/13/2022 1:48 PM

## 2022-11-14 ENCOUNTER — Encounter (INDEPENDENT_AMBULATORY_CARE_PROVIDER_SITE_OTHER): Payer: Medicare Other | Admitting: Vascular Surgery

## 2022-11-14 DIAGNOSIS — I10 Essential (primary) hypertension: Secondary | ICD-10-CM

## 2022-11-14 DIAGNOSIS — I890 Lymphedema, not elsewhere classified: Secondary | ICD-10-CM

## 2022-11-21 ENCOUNTER — Encounter (INDEPENDENT_AMBULATORY_CARE_PROVIDER_SITE_OTHER): Payer: Self-pay | Admitting: Vascular Surgery

## 2022-11-21 ENCOUNTER — Ambulatory Visit (INDEPENDENT_AMBULATORY_CARE_PROVIDER_SITE_OTHER): Payer: Medicare Other | Admitting: Vascular Surgery

## 2022-11-21 VITALS — BP 102/69 | HR 94 | Resp 16 | Wt 187.2 lb

## 2022-11-21 DIAGNOSIS — I890 Lymphedema, not elsewhere classified: Secondary | ICD-10-CM | POA: Diagnosis not present

## 2022-11-21 DIAGNOSIS — I10 Essential (primary) hypertension: Secondary | ICD-10-CM | POA: Diagnosis not present

## 2022-11-27 ENCOUNTER — Encounter (INDEPENDENT_AMBULATORY_CARE_PROVIDER_SITE_OTHER): Payer: Self-pay | Admitting: Vascular Surgery

## 2022-11-27 NOTE — Progress Notes (Signed)
MRN : [id number]  Danielle Robinson is a 72 y.o. ([birth year]) female who presents with chief complaint of legs swell.  History of Present Illness:  Patient is seen for evaluation of leg pain and leg swelling. The patient first noticed the swelling remotely. The swelling is associated with pain and discoloration. The pain and swelling worsens with prolonged dependency and improves with elevation. The pain is unrelated to activity.  The patient notes that in the morning the legs are significantly improved but they steadily worsened throughout the course of the day. The patient also notes a steady worsening of the discoloration in the ankle and shin area.   The patient denies claudication symptoms.  The patient denies symptoms consistent with rest pain.  The patient denies and extensive history of DJD and LS spine disease.    The patient has had past venous surgery.  Elevation makes the leg symptoms better, dependency makes them much worse. There is no history of ulcerations. The patient denies any recent changes in medications.  The patient has not been wearing graduated compression.  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 There is no prior history of phlebitis. There is no history of primary lymphedema.  No history of malignancies. No history of trauma or groin or pelvic surgery. There is no history of radiation treatment to the groin or pelvis  The patient denies amaurosis fugax or recent TIA symptoms. There are no recent neurological changes noted. The patient denies recent episodes of angina or shortness of breath  Current Meds  Medication Sig   ELIQUIS 5 MG TABS tablet Take 5 mg by mouth 2 (two) times daily.   furosemide (LASIX) 40 MG tablet Take 40 mg by mouth daily as needed for fluid or edema.    Melatonin 5 MG CAPS Take 1 capsule by mouth at bedtime.   metoprolol tartrate (LOPRESSOR) 25 MG tablet Take 12.5 mg by mouth in the morning, at noon, and  at bedtime.   Olopatadine HCl (PAZEO) 0.7 % SOLN Apply 1 drop to eye daily.    Past Medical History:  Diagnosis Date   Anxiety    Arthritis    Atrial fibrillation (HCC)    CHF (congestive heart failure) (HCC)    Complication of anesthesia    difficulty waking up after foot surgery in 2016 at [facility]   Depression    Dysrhythmia    GERD (gastroesophageal reflux disease)    H/O active rheumatic fever    Hypertension    Palpitations    Panic attacks     Past Surgical History:  Procedure Laterality Date   ABDOMINAL HYSTERECTOMY     BILATERAL SALPINGOOPHORECTOMY     bilateral varicise vein laser surgery     CESAREAN SECTION     x 3   COLONOSCOPY     COLONOSCOPY WITH PROPOFOL N/A 08/13/2018   Procedure: COLONOSCOPY WITH PROPOFOL;  Surgeon: Scot Jun, MD;  Location: [facility] ENDOSCOPY;  Service: Endoscopy;  Laterality: N/A;   FOOT FRACTURE SURGERY     FOOT SURGERY     KNEE ARTHROPLASTY Right 05/01/2019   Procedure: COMPUTER ASSISTED TOTAL KNEE ARTHROPLASTY;  Surgeon: Donato Heinz, MD;  Location: [facility] ORS;  Service: Orthopedics;  Laterality: Right;   KNEE ARTHROSCOPY Right 09/28/2015   Procedure: ARTHROSCOPY KNEE, MEDIAL AND LATERAL MENISECTOMY, CHONDROPLASTY;  Surgeon: Donato Heinz, MD;  Location: [facility] ORS;  Service: Orthopedics;  Laterality: Right;    Social History Social History   Tobacco Use   Smoking status: Former   Smokeless tobacco: Never  Building services engineer Use: Never used  Substance Use Topics   Alcohol use: No    Alcohol/week: 0.0 standard drinks of alcohol   Drug use: No    Family History Family History  Problem Relation Age of Onset   Hypertension Father     No Known Allergies   REVIEW OF SYSTEMS (Negative unless checked)  Constitutional: [] Weight loss  [] Fever  [] Chills Cardiac: [] Chest pain   [] Chest pressure   [] Palpitations   [] Shortness of breath when laying flat   [] Shortness of breath with exertion. Vascular:  [] Pain in legs with  walking   [x] Pain in legs with standing  [] History of DVT   [] Phlebitis   [x] Swelling in legs   [] Varicose veins   [] Non-healing ulcers Pulmonary:   [] Uses home oxygen   [] Productive cough   [] Hemoptysis   [] Wheeze  [] COPD   [] Asthma Neurologic:  [] Dizziness   [] Seizures   [] History of stroke   [] History of TIA  [] Aphasia   [] Vissual changes   [] Weakness or numbness in arm   [] Weakness or numbness in leg Musculoskeletal:   [] Joint swelling   [] Joint pain   [] Low back pain Hematologic:  [] Easy bruising  [] Easy bleeding   [] Hypercoagulable state   [] Anemic Gastrointestinal:  [] Diarrhea   [] Vomiting  [] Gastroesophageal reflux/heartburn   [] Difficulty swallowing. Genitourinary:  [] Chronic kidney disease   [] Difficult urination  [] Frequent urination   [] Blood in urine Skin:  [] Rashes   [] Ulcers  Psychological:  [] History of anxiety   []  History of major depression.  Physical Examination  Vitals:   11/21/22 1350  BP: 102/69  Pulse: 94  Resp: 16  Weight: 187 lb 3.2 oz (84.9 kg)   Body mass index is 36.56 kg/m. Gen: WD/WN, NAD Head: [place]/AT, No temporalis wasting.  Ear/Nose/Throat: Hearing grossly intact, nares w/o erythema or drainage, pinna without lesions Eyes: PER, EOMI, sclera nonicteric.  Neck: Supple, no gross masses.  No JVD.  Pulmonary:  Good air movement, no audible wheezing, no use of accessory muscles.  Cardiac: RRR, precordium not hyperdynamic. Vascular:  scattered varicosities present bilaterally.  Mild venous stasis changes to the legs bilaterally.  3-4+ soft pitting edema, CEAP C4sEpAsPr  Vessel Right Left  Radial Palpable Palpable  Gastrointestinal: soft, non-distended. No guarding/no peritoneal signs.  Musculoskeletal: M/S 5/5 throughout.  No deformity.  Neurologic: CN 2-12 intact. Pain and light touch intact in extremities.  Symmetrical.  Speech is fluent. Motor exam as listed above. Psychiatric: Judgment intact, Mood & affect appropriate for pt's clinical  situation. Dermatologic: Venous rashes no ulcers noted.  No changes consistent with cellulitis. Lymph : No lichenification or skin changes of chronic lymphedema.  CBC Lab Results  Component Value Date   WBC 4.1 09/18/2015   HGB 15.0 09/28/2015   HCT 44.0 09/28/2015   MCV 88.6 09/18/2015   PLT 205 09/18/2015    BMET    Component Value Date/Time   NA 141 09/28/2015 1525   K 4.0 09/28/2015 1525   K 4.3 05/28/2014 1210   GLUCOSE 90 09/28/2015 1525   CrCl cannot be calculated (No successful lab value found.).  COAG Lab Results  Component Value Date   INR 0.9 04/23/2019    Radiology No results found.   Assessment/Plan 1. Lymphedema Recommend:  I  have had a long discussion with the patient regarding swelling and why it  causes symptoms.  Patient will begin wearing graduated compression on a daily basis a prescription was given. The patient will  wear the stockings first thing in the morning and removing them in the evening. The patient is instructed specifically not to sleep in the stockings.   In addition, behavioral modification will be initiated.  This will include frequent elevation, use of over the counter pain medications and exercise such as walking.  Consideration for a lymph pump will also be made based upon the effectiveness of conservative therapy.  This would help to improve the edema control and prevent sequela such as ulcers and infections   Patient should undergo duplex ultrasound of the venous system to ensure that DVT or reflux is not present.  The patient will follow-up with me after the ultrasound.  - VAS [place] LOWER EXTREMITY VENOUS REFLUX; Future  2. Benign essential hypertension Continue antihypertensive medications as already ordered, these medications have been reviewed and there are no changes at this time.    Levora Dredge, MD  11/27/2022 2:27 PM

## 2023-03-21 ENCOUNTER — Other Ambulatory Visit (INDEPENDENT_AMBULATORY_CARE_PROVIDER_SITE_OTHER): Payer: Self-pay | Admitting: Vascular Surgery

## 2023-03-21 DIAGNOSIS — M7989 Other specified soft tissue disorders: Secondary | ICD-10-CM

## 2023-03-21 NOTE — Progress Notes (Signed)
MRN : [id number]  Danielle Robinson is a 72 y.o. (Nov 29, 1950) female who presents with chief complaint of legs swell.  History of Present Illness:   The patient returns to the office for followup evaluation regarding leg swelling.  The swelling has persisted and the pain associated with swelling continues. There have not been any interval development of a ulcerations or wounds.  Since the previous visit the patient has been wearing graduated compression stockings and has noted little if any improvement in the lymphedema. The patient has been using compression routinely morning until night.  The patient also states elevation during the day and exercise is being done too.  No outpatient medications have been marked as taking for the 03/23/23 encounter (Appointment) with Gilda Crease, Latina Craver, MD.    Past Medical History:  Diagnosis Date   Anxiety    Arthritis    Atrial fibrillation (HCC)    CHF (congestive heart failure) (HCC)    Complication of anesthesia    difficulty waking up after foot surgery in 2016 at [facility]   Depression    Dysrhythmia    GERD (gastroesophageal reflux disease)    H/O active rheumatic fever    Hypertension    Palpitations    Panic attacks     Past Surgical History:  Procedure Laterality Date   ABDOMINAL HYSTERECTOMY     BILATERAL SALPINGOOPHORECTOMY     bilateral varicise vein laser surgery     CESAREAN SECTION     x 3   COLONOSCOPY     COLONOSCOPY WITH PROPOFOL N/A 08/13/2018   Procedure: COLONOSCOPY WITH PROPOFOL;  Surgeon: Scot Jun, MD;  Location: [facility] ENDOSCOPY;  Service: Endoscopy;  Laterality: N/A;   FOOT FRACTURE SURGERY     FOOT SURGERY     KNEE ARTHROPLASTY Right 05/01/2019   Procedure: COMPUTER ASSISTED TOTAL KNEE ARTHROPLASTY;  Surgeon: Donato Heinz, MD;  Location: [facility] ORS;  Service: Orthopedics;  Laterality: Right;   KNEE ARTHROSCOPY Right 09/28/2015   Procedure: ARTHROSCOPY KNEE, MEDIAL AND LATERAL  MENISECTOMY, CHONDROPLASTY;  Surgeon: Donato Heinz, MD;  Location: [facility] ORS;  Service: Orthopedics;  Laterality: Right;    Social History Social History   Tobacco Use   Smoking status: Former   Smokeless tobacco: Never  Advertising account planner   Vaping status: Never Used  Substance Use Topics   Alcohol use: No    Alcohol/week: 0.0 standard drinks of alcohol   Drug use: No    Family History Family History  Problem Relation Age of Onset   Hypertension Father     No Known Allergies   REVIEW OF SYSTEMS (Negative unless checked)  Constitutional: [] Weight loss  [] Fever  [] Chills Cardiac: [] Chest pain   [] Chest pressure   [] Palpitations   [] Shortness of breath when laying flat   [] Shortness of breath with exertion. Vascular:  [] Pain in legs with walking   [x] Pain in legs with standing  [] History of DVT   [] Phlebitis   [x] Swelling in legs   [] Varicose veins   [] Non-healing ulcers Pulmonary:   [] Uses home oxygen   [] Productive cough   [] Hemoptysis   [] Wheeze  [] COPD   [] Asthma Neurologic:  [] Dizziness   [] Seizures   [] History of stroke   [] History of TIA  [] Aphasia   [] Vissual changes   [] Weakness or numbness in arm   [] Weakness or  numbness in leg Musculoskeletal:   [] Joint swelling   [] Joint pain   [] Low back pain Hematologic:  [] Easy bruising  [] Easy bleeding   [] Hypercoagulable state   [] Anemic Gastrointestinal:  [] Diarrhea   [] Vomiting  [] Gastroesophageal reflux/heartburn   [] Difficulty swallowing. Genitourinary:  [] Chronic kidney disease   [] Difficult urination  [] Frequent urination   [] Blood in urine Skin:  [] Rashes   [] Ulcers  Psychological:  [] History of anxiety   []  History of major depression.  Physical Examination  There were no vitals filed for this visit. There is no height or weight on file to calculate BMI. Gen: WD/WN, NAD Head: [place]/AT, No temporalis wasting.  Ear/Nose/Throat: Hearing grossly intact, nares w/o erythema or drainage, pinna without lesions Eyes: PER, EOMI,  sclera nonicteric.  Neck: Supple, no gross masses.  No JVD.  Pulmonary:  Good air movement, no audible wheezing, no use of accessory muscles.  Cardiac: RRR, precordium not hyperdynamic. Vascular:  scattered varicosities present bilaterally.  Mild venous stasis changes to the legs bilaterally.  3-4+ soft pitting edema, CEAP C4sEpAsPr  Vessel Right Left  Radial Palpable Palpable  Gastrointestinal: soft, non-distended. No guarding/no peritoneal signs.  Musculoskeletal: M/S 5/5 throughout.  No deformity.  Neurologic: CN 2-12 intact. Pain and light touch intact in extremities.  Symmetrical.  Speech is fluent. Motor exam as listed above. Psychiatric: Judgment intact, Mood & affect appropriate for pt's clinical situation. Dermatologic: Venous rashes no ulcers noted.  No changes consistent with cellulitis. Lymph : No lichenification or skin changes of chronic lymphedema.  CBC Lab Results  Component Value Date   WBC 4.1 09/18/2015   HGB 15.0 09/28/2015   HCT 44.0 09/28/2015   MCV 88.6 09/18/2015   PLT 205 09/18/2015    BMET    Component Value Date/Time   NA 141 09/28/2015 1525   K 4.0 09/28/2015 1525   K 4.3 05/28/2014 1210   GLUCOSE 90 09/28/2015 1525   CrCl cannot be calculated (No successful lab value found.).  COAG Lab Results  Component Value Date   INR 0.9 04/23/2019    Radiology No results found.   Assessment/Plan 1. Lymphedema Recommend:  No surgery or intervention at this point in time.   The Patient is CEAP C4sEpAsPr.  The patient has been wearing compression for more than 12 weeks with no or little benefit.  The patient has been exercising daily for more than 12 weeks. The patient has been elevating and taking OTC pain medications for more than 12 weeks.  None of these have have eliminated the pain related to the lymphedema or the discomfort regarding excessive swelling and venous congestion.    I have reviewed my discussion with the patient regarding lymphedema  and why it  causes symptoms.  Patient will continue wearing graduated compression on a daily basis. The patient should put the compression on first thing in the morning and removing them in the evening. The patient should not sleep in the compression.   In addition, behavioral modification throughout the day will be continued.  This will include frequent elevation (such as in a recliner), use of over the counter pain medications as needed and exercise such as walking.  The systemic causes for chronic edema such as liver, kidney and cardiac etiologies do not appear to have significant changed over the past year.    The patient has chronic , severe lymphedema with hyperpigmentation of the skin and has done MLD, skin care, medication, diet, exercise, elevation and compression for 4 weeks with no improvement,  I am recommending a lymphedema pump.  The patient still has stage 3 lymphedema and therefore, I believe that a lymph pump is needed to improve the control of the patient's lymphedema and improve the quality of life.  Additionally, a lymph pump is warranted because it will reduce the risk of cellulitis and ulceration in the future.  Patient should follow-up in six months   2. Status post total knee replacement, unspecified laterality Continue NSAID medications as already ordered, these medications have been reviewed and there are no changes at this time.  Continued activity and therapy was stressed.  3. Benign essential hypertension Continue antihypertensive medications as already ordered, these medications have been reviewed and there are no changes at this time.    Levora Dredge, MD  03/21/2023 1:59 PM

## 2023-03-23 ENCOUNTER — Ambulatory Visit (INDEPENDENT_AMBULATORY_CARE_PROVIDER_SITE_OTHER): Payer: Medicare Other | Admitting: Vascular Surgery

## 2023-03-23 ENCOUNTER — Ambulatory Visit (INDEPENDENT_AMBULATORY_CARE_PROVIDER_SITE_OTHER): Payer: Medicare Other

## 2023-03-23 VITALS — BP 121/78 | HR 54 | Resp 18 | Ht 62.0 in | Wt 196.0 lb

## 2023-03-23 DIAGNOSIS — M7989 Other specified soft tissue disorders: Secondary | ICD-10-CM | POA: Diagnosis not present

## 2023-03-23 DIAGNOSIS — I890 Lymphedema, not elsewhere classified: Secondary | ICD-10-CM | POA: Diagnosis not present

## 2023-03-23 DIAGNOSIS — Z96659 Presence of unspecified artificial knee joint: Secondary | ICD-10-CM | POA: Diagnosis not present

## 2023-03-23 DIAGNOSIS — I10 Essential (primary) hypertension: Secondary | ICD-10-CM

## 2023-03-26 ENCOUNTER — Encounter (INDEPENDENT_AMBULATORY_CARE_PROVIDER_SITE_OTHER): Payer: Self-pay | Admitting: Vascular Surgery

## 2023-07-10 ENCOUNTER — Telehealth (INDEPENDENT_AMBULATORY_CARE_PROVIDER_SITE_OTHER): Payer: Self-pay | Admitting: Nurse Practitioner

## 2023-07-10 NOTE — Telephone Encounter (Signed)
After 4 weeks of daily exercise, elevation, and compression patient lymphedema and hyperpigmentation has not improved. Recommendation of pump. Measurements: Left ankle  34.2cm (10/2);  34.4cm(10/31) Right ankle  35.2cm(10/2)  35.3cm(10/31) Left calf      46.0cm(10/2);  46.1cm(10/31) Right calf         44.6cm(10/2);  44.8cm(10/31)

## 2023-09-18 NOTE — Progress Notes (Deleted)
 MRN : [id number]  Danielle Robinson is a 73 y.o. ([birth year]) female who presents with chief complaint of legs swell.  History of Present Illness:   The patient returns to the office for followup evaluation regarding leg swelling.  The swelling has persisted but with the lymph pump is under much, much better controlled. The pain associated with swelling is decreased. There have not been any interval development of a ulcerations or wounds.  The patient denies problems with the pump, noting it is working well and the leggings are in good condition.  Since the previous visit the patient has been wearing graduated compression stockings and using the lymph pump on a routine basis and  has noted significant improvement in the lymphedema.   Patient stated the lymph pump has been helpful with the treatment of the lymphedema.   No outpatient medications have been marked as taking for the 09/21/23 encounter (Appointment) with Gilda Crease, Latina Craver, MD.    Past Medical History:  Diagnosis Date   Anxiety    Arthritis    Atrial fibrillation (HCC)    CHF (congestive heart failure) (HCC)    Complication of anesthesia    difficulty waking up after foot surgery in 2016 at [facility]   Depression    Dysrhythmia    GERD (gastroesophageal reflux disease)    H/O active rheumatic fever    Hypertension    Palpitations    Panic attacks     Past Surgical History:  Procedure Laterality Date   ABDOMINAL HYSTERECTOMY     BILATERAL SALPINGOOPHORECTOMY     bilateral varicise vein laser surgery     CESAREAN SECTION     x 3   COLONOSCOPY     COLONOSCOPY WITH PROPOFOL N/A 08/13/2018   Procedure: COLONOSCOPY WITH PROPOFOL;  Surgeon: Scot Jun, MD;  Location: [facility] ENDOSCOPY;  Service: Endoscopy;  Laterality: N/A;   FOOT FRACTURE SURGERY     FOOT SURGERY     KNEE ARTHROPLASTY Right 05/01/2019   Procedure: COMPUTER ASSISTED TOTAL KNEE ARTHROPLASTY;  Surgeon: Donato Heinz, MD;   Location: [facility] ORS;  Service: Orthopedics;  Laterality: Right;   KNEE ARTHROSCOPY Right 09/28/2015   Procedure: ARTHROSCOPY KNEE, MEDIAL AND LATERAL MENISECTOMY, CHONDROPLASTY;  Surgeon: Donato Heinz, MD;  Location: [facility] ORS;  Service: Orthopedics;  Laterality: Right;    Social History Social History   Tobacco Use   Smoking status: Former   Smokeless tobacco: Never  Advertising account planner   Vaping status: Never Used  Substance Use Topics   Alcohol use: No    Alcohol/week: 0.0 standard drinks of alcohol   Drug use: No    Family History Family History  Problem Relation Age of Onset   Hypertension Father     No Known Allergies   REVIEW OF SYSTEMS (Negative unless checked)  Constitutional: [] Weight loss  [] Fever  [] Chills Cardiac: [] Chest pain   [] Chest pressure   [] Palpitations   [] Shortness of breath when laying flat   [] Shortness of breath with exertion. Vascular:  [] Pain in legs with walking   [x] Pain in legs with standing  [] History of DVT   [] Phlebitis   [x] Swelling in legs   [] Varicose veins   [] Non-healing ulcers Pulmonary:   [] Uses home oxygen   [] Productive cough   [] Hemoptysis   [] Wheeze  [] COPD   [] Asthma Neurologic:  []   Dizziness   [] Seizures   [] History of stroke   [] History of TIA  [] Aphasia   [] Vissual changes   [] Weakness or numbness in arm   [] Weakness or numbness in leg Musculoskeletal:   [] Joint swelling   [] Joint pain   [] Low back pain Hematologic:  [] Easy bruising  [] Easy bleeding   [] Hypercoagulable state   [] Anemic Gastrointestinal:  [] Diarrhea   [] Vomiting  [] Gastroesophageal reflux/heartburn   [] Difficulty swallowing. Genitourinary:  [] Chronic kidney disease   [] Difficult urination  [] Frequent urination   [] Blood in urine Skin:  [] Rashes   [] Ulcers  Psychological:  [] History of anxiety   []  History of major depression.  Physical Examination  There were no vitals filed for this visit. There is no height or weight on file to calculate BMI. Gen: WD/WN, NAD Head:  [place]/AT, No temporalis wasting.  Ear/Nose/Throat: Hearing grossly intact, nares w/o erythema or drainage, pinna without lesions Eyes: PER, EOMI, sclera nonicteric.  Neck: Supple, no gross masses.  No JVD.  Pulmonary:  Good air movement, no audible wheezing, no use of accessory muscles.  Cardiac: RRR, precordium not hyperdynamic. Vascular:  scattered varicosities present bilaterally.  Mild venous stasis changes to the legs bilaterally.  3-4+ soft pitting edema, CEAP C4sEpAsPr  Vessel Right Left  Radial Palpable Palpable  Gastrointestinal: soft, non-distended. No guarding/no peritoneal signs.  Musculoskeletal: M/S 5/5 throughout.  No deformity.  Neurologic: CN 2-12 intact. Pain and light touch intact in extremities.  Symmetrical.  Speech is fluent. Motor exam as listed above. Psychiatric: Judgment intact, Mood & affect appropriate for pt's clinical situation. Dermatologic: Venous rashes no ulcers noted.  No changes consistent with cellulitis. Lymph : No lichenification or skin changes of chronic lymphedema.  CBC Lab Results  Component Value Date   WBC 4.1 09/18/2015   HGB 15.0 09/28/2015   HCT 44.0 09/28/2015   MCV 88.6 09/18/2015   PLT 205 09/18/2015    BMET    Component Value Date/Time   NA 141 09/28/2015 1525   K 4.0 09/28/2015 1525   K 4.3 05/28/2014 1210   GLUCOSE 90 09/28/2015 1525   CrCl cannot be calculated (No successful lab value found.).  COAG Lab Results  Component Value Date   INR 0.9 04/23/2019    Radiology No results found.   Assessment/Plan There are no diagnoses linked to this encounter.   Levora Dredge, MD  09/18/2023 9:19 PM

## 2023-09-21 ENCOUNTER — Encounter (INDEPENDENT_AMBULATORY_CARE_PROVIDER_SITE_OTHER): Payer: Self-pay

## 2023-09-21 ENCOUNTER — Ambulatory Visit (INDEPENDENT_AMBULATORY_CARE_PROVIDER_SITE_OTHER): Payer: Medicare Other | Admitting: Vascular Surgery

## 2023-09-21 DIAGNOSIS — I480 Paroxysmal atrial fibrillation: Secondary | ICD-10-CM

## 2023-09-21 DIAGNOSIS — Z96659 Presence of unspecified artificial knee joint: Secondary | ICD-10-CM

## 2023-09-21 DIAGNOSIS — I10 Essential (primary) hypertension: Secondary | ICD-10-CM

## 2023-09-21 DIAGNOSIS — I890 Lymphedema, not elsewhere classified: Secondary | ICD-10-CM

## 2023-11-17 IMAGING — MR MR SHOULDER*R* W/O CM
4 of 5 series · 31 of 40 positions shown · non-contrast
Comparison: None.

CLINICAL DATA: Right shoulder pain for 1 year. Worsening. No known
injury. No prior right shoulder surgery.

EXAM:
MRI OF THE RIGHT SHOULDER WITHOUT CONTRAST
TECHNIQUE: Multiplanar, multisequence MR imaging of the shoulder was performed.
No intravenous contrast was administered.

[Series 5: T2 fat-sat · axial · right · 4.0mm · 0.44mm/px · z∈[-65,+55]mm · 8 of 26 slices shown (1 of 3)]
[im 1/26]
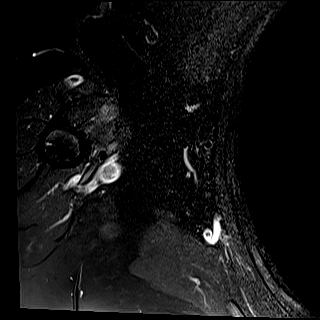
[im 4/26]
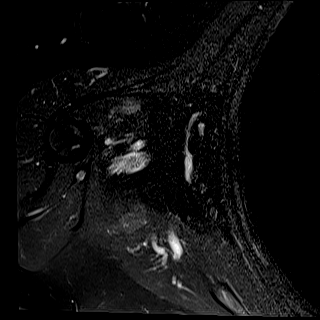
[im 8/26]
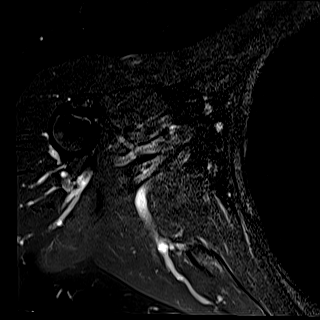
[im 11/26]
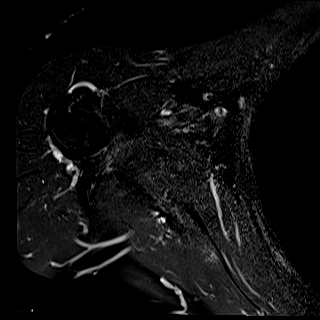
[im 15/26]
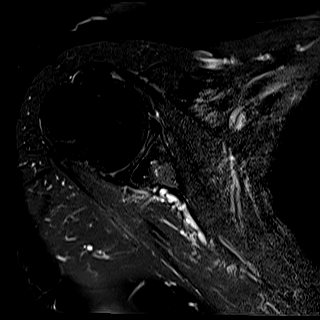
[im 18/26]
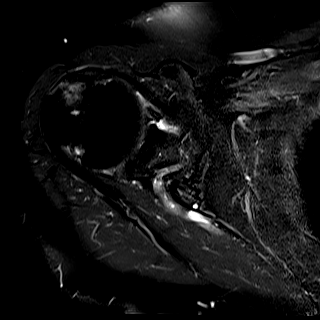
[im 22/26]
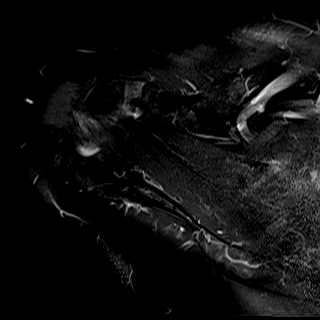
[im 26/26]
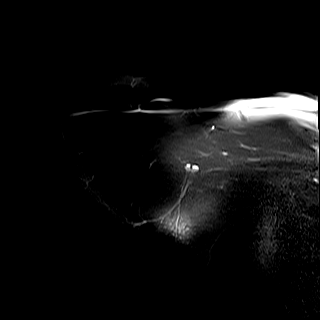

[Series 6: PD · oblique · right · 4.0mm · 0.44mm/px · 9 of 26 slices shown]
[im 1/26]
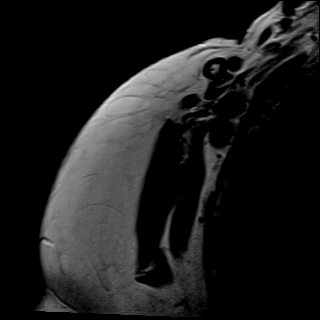
[im 4/26]
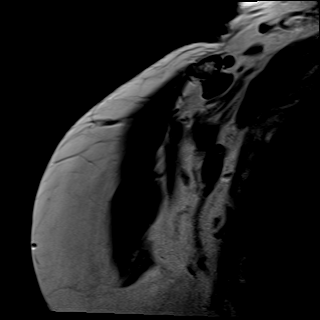
[im 7/26]
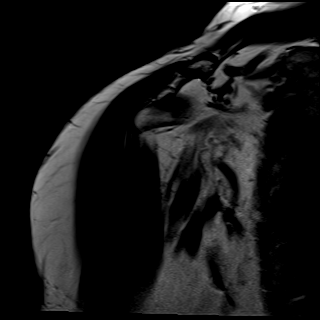
[im 10/26]
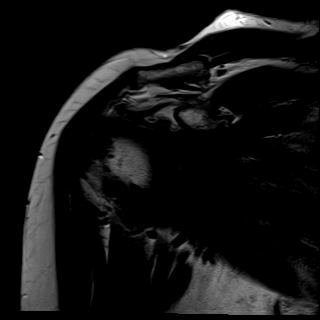
[im 13/26]
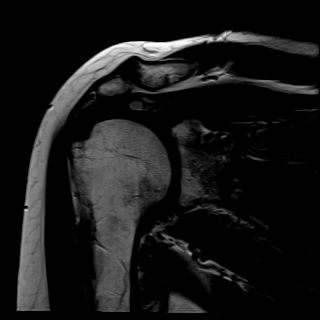
[im 16/26]
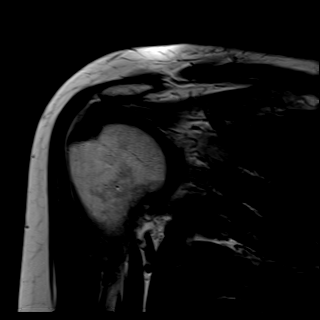
[im 19/26]
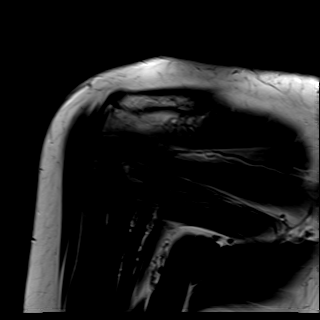
[im 22/26]
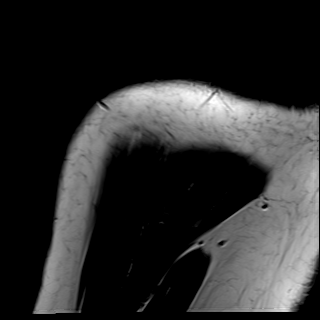
[im 26/26]
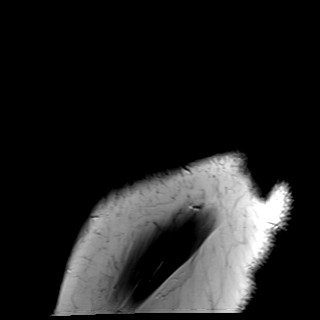

[Series 7: T2 fat-sat · oblique · right · 4.0mm · 0.44mm/px · 9 of 26 slices shown (2 of 3)]
[im 1/26]
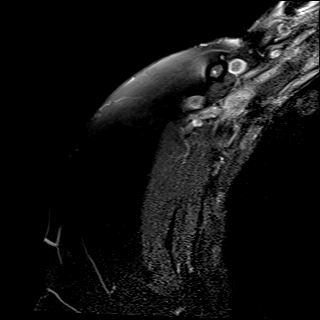
[im 4/26]
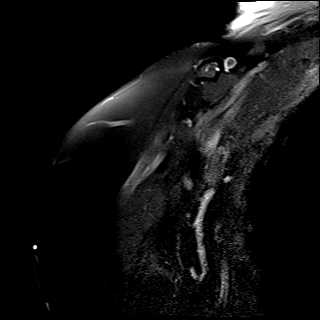
[im 7/26]
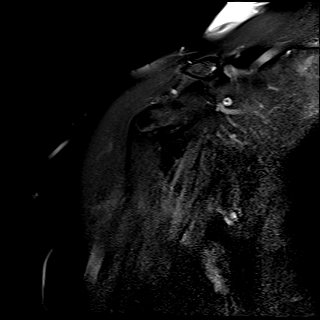
[im 10/26]
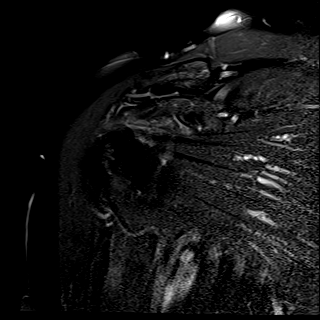
[im 13/26]
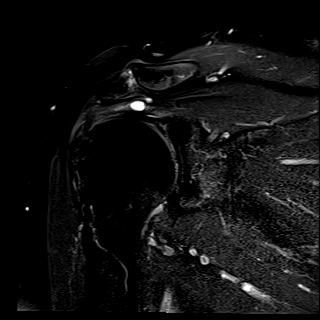
[im 16/26]
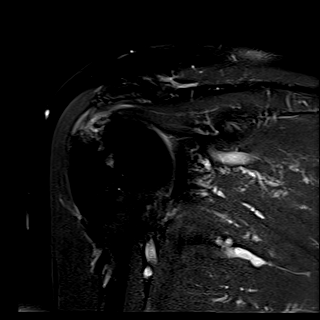
[im 19/26]
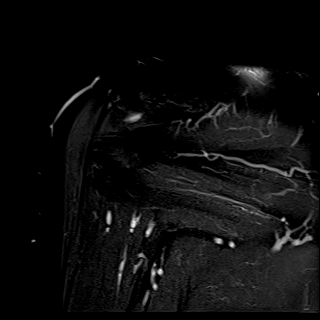
[im 22/26]
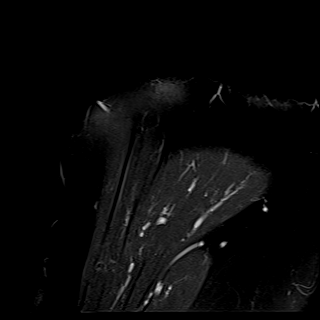
[im 26/26]
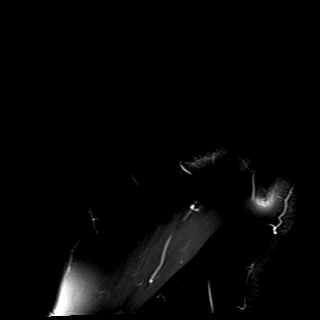

[Series 8: T2 fat-sat · oblique · right · 4.0mm · 0.22mm/px · 5 of 22 slices shown (3 of 3)]
[im 1/22]
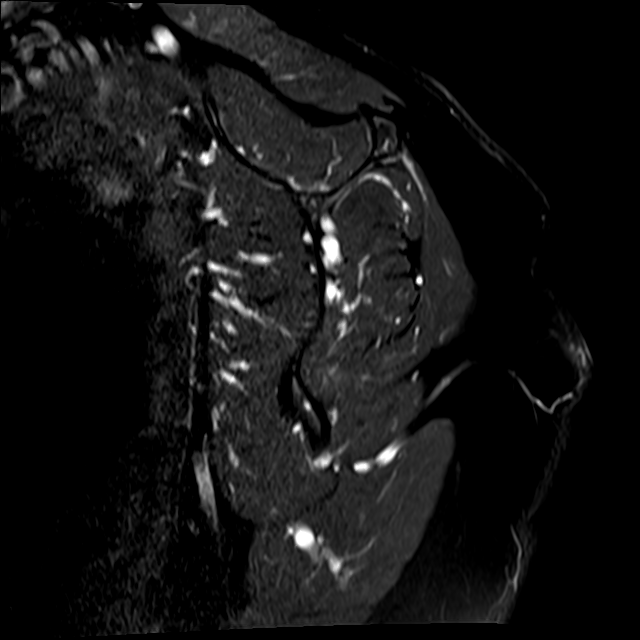
[im 4/22]
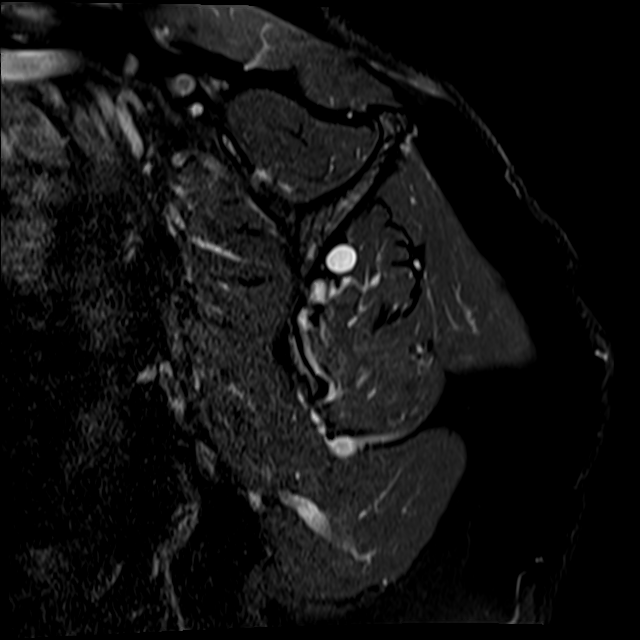
[im 8/22]
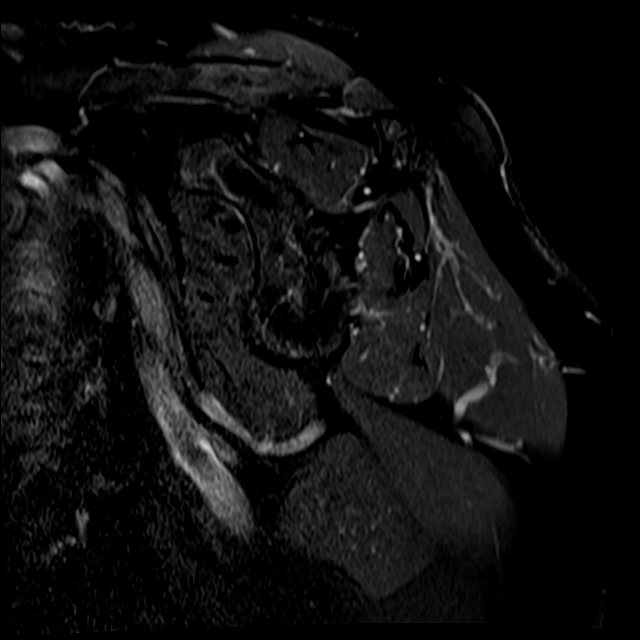
[im 11/22]
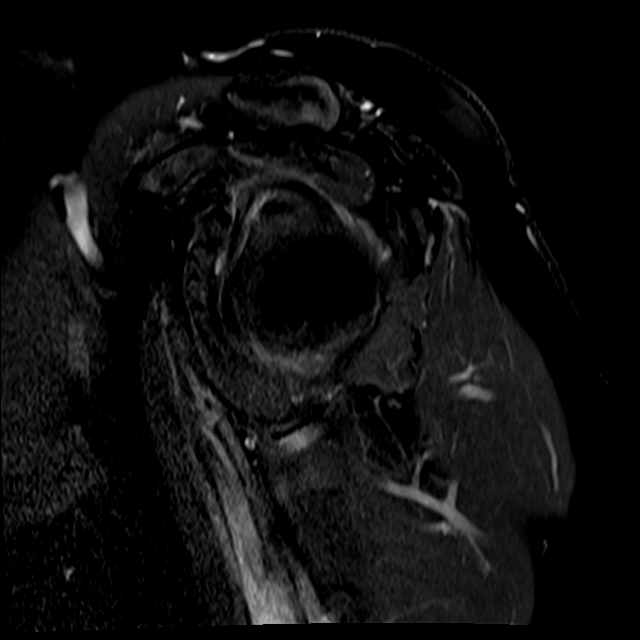
[im 18/22]
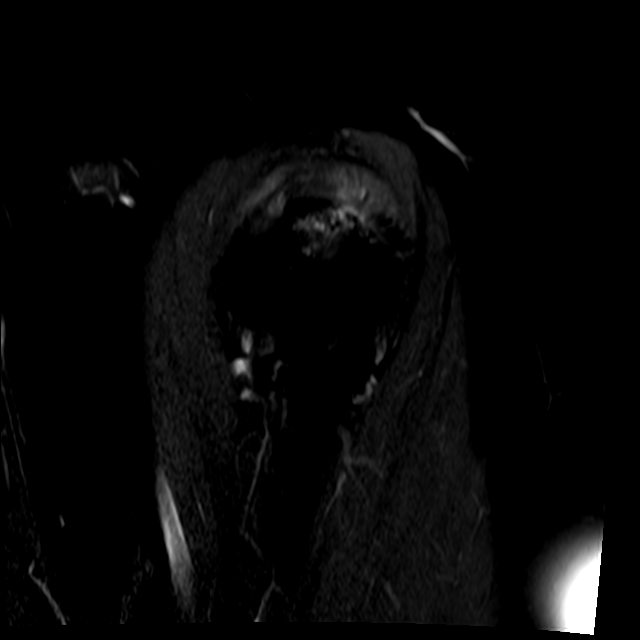

[31 of 40 positions shown; findings below may reference images not displayed]

FINDINGS: Rotator cuff: There is mild fluid bright signal at the anterior
aspect of the supraspinatus musculotendinous junction in a region
measuring up to 9 mm in AP dimension (sagittal image 14) and 11 mm
along the length of the muscle and tendon (coronal image 14). There
is also fluid bright signal at the slightly more posterior
supraspinatus tendon footprint measuring up to 8 mm in AP dimension
and 5 mm in transverse dimension (sagittal 19 and coronal image 12).
There is mild-to-moderate underlying chronic subcortical cystic
change in the humeral head. Moderate surrounding tendinosis. The
infraspinatus, subscapularis, and teres minor are intact.

Muscles: No rotator cuff muscle atrophy, fatty infiltration, or
edema.

Biceps long head: The intra-articular long head of the biceps tendon
is intact.

Acromioclavicular Joint: There are mild degenerative changes of the
acromioclavicular joint including joint space narrowing, subchondral
marrow edema, and peripheral osteophytosis. Type II acromion. No
subacromial/subdeltoid bursitis.

Glenohumeral Joint: Moderate thinning of the glenoid cartilage. Mild
thinning of the inferior medial humeral head cartilage.

Labrum: Mild degenerative irregularity of the superior glenoid
labrum.

Bones:  No acute fracture.

Other: None.
IMPRESSION: 1. Small interstitial tear of the supraspinatus musculotendinous
junction without retraction. There is partial-thickness midsubstance
tearing within the more distal supraspinatus tendon measuring up to
8 mm in AP dimension. No tendon retraction.
2. Mild degenerative changes of the acromioclavicular joint.
3. Mild-to-moderate glenohumeral cartilage degenerative changes.

## 2024-07-01 ENCOUNTER — Ambulatory Visit
Admission: RE | Admit: 2024-07-01 | Discharge: 2024-07-01 | Disposition: A | Discharge status: Home / Self Care | Source: Ambulatory Visit | Attending: Internal Medicine | Admitting: Internal Medicine

## 2024-07-01 ENCOUNTER — Other Ambulatory Visit: Payer: Self-pay | Admitting: Internal Medicine

## 2024-07-01 ENCOUNTER — Encounter: Payer: Self-pay | Admitting: Internal Medicine

## 2024-07-01 DIAGNOSIS — R519 Headache, unspecified: Secondary | ICD-10-CM

## 2024-07-01 DIAGNOSIS — G4452 New daily persistent headache (NDPH): Secondary | ICD-10-CM
